# Patient Record
Sex: Male | Born: 1975 | Hispanic: No | Marital: Single
Health system: Northeastern US, Academic
[De-identification: ages and names within clinical notes are randomized; demographics above are authoritative.]

## PROBLEM LIST (undated history)

## (undated) HISTORY — PX: CHOLECYSTECTOMY: SHX55

---

## 1999-11-02 ENCOUNTER — Emergency Department (HOSPITAL_COMMUNITY): Admission: EM | Admit: 1999-11-02 | Discharge: 1999-11-02 | Payer: Self-pay | Admitting: Emergency Medicine

## 2000-06-11 ENCOUNTER — Emergency Department (HOSPITAL_COMMUNITY): Admission: EM | Admit: 2000-06-11 | Discharge: 2000-06-11 | Payer: Self-pay

## 2000-06-14 ENCOUNTER — Emergency Department (HOSPITAL_COMMUNITY): Admission: EM | Admit: 2000-06-14 | Discharge: 2000-06-14 | Payer: Self-pay | Admitting: Emergency Medicine

## 2002-05-24 ENCOUNTER — Encounter: Payer: Self-pay | Admitting: Emergency Medicine

## 2002-05-24 ENCOUNTER — Emergency Department (HOSPITAL_COMMUNITY): Admission: EM | Admit: 2002-05-24 | Discharge: 2002-05-24 | Payer: Self-pay

## 2003-08-16 ENCOUNTER — Inpatient Hospital Stay (HOSPITAL_COMMUNITY): Admission: AC | Admit: 2003-08-16 | Discharge: 2003-08-27 | Payer: Self-pay

## 2003-10-10 ENCOUNTER — Inpatient Hospital Stay (HOSPITAL_COMMUNITY): Admission: RE | Admit: 2003-10-10 | Discharge: 2003-10-12 | Payer: Self-pay | Admitting: Orthopedic Surgery

## 2003-10-31 ENCOUNTER — Encounter: Admission: RE | Admit: 2003-10-31 | Discharge: 2004-01-03 | Payer: Self-pay | Admitting: Orthopedic Surgery

## 2004-02-19 ENCOUNTER — Encounter: Admission: RE | Admit: 2004-02-19 | Discharge: 2004-03-26 | Payer: Self-pay | Admitting: Orthopedic Surgery

## 2004-10-06 ENCOUNTER — Ambulatory Visit: Payer: Self-pay | Admitting: Family Medicine

## 2004-10-27 ENCOUNTER — Ambulatory Visit: Payer: Self-pay | Admitting: Family Medicine

## 2005-02-24 ENCOUNTER — Ambulatory Visit: Payer: Self-pay | Admitting: Family Medicine

## 2006-09-29 DIAGNOSIS — K219 Gastro-esophageal reflux disease without esophagitis: Secondary | ICD-10-CM

## 2007-03-15 ENCOUNTER — Encounter: Admission: RE | Admit: 2007-03-15 | Discharge: 2007-03-15 | Payer: Self-pay | Admitting: Orthopedic Surgery
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
## 2010-12-18 NOTE — Op Note (Signed)
Stephen Riddle, Stephen Riddle                          ACCOUNT NO.:  [id number]   MEDICAL RECORD NO.:  [id number]                   PATIENT TYPE:  INP   LOCATION:  5036                                 FACILITY:  [facility]   PHYSICIAN:  Burnard Bunting, M.D.                 DATE OF BIRTH:  05/29/1976   DATE OF PROCEDURE:  08/18/2003  DATE OF DISCHARGE:                                 OPERATIVE REPORT   PREOPERATIVE DIAGNOSIS:  Open right tibia-fibula fracture.   POSTOPERATIVE DIAGNOSIS:  Open right tibia-fibula fracture.   PROCEDURE:  Excisional debridement of necrotic skin, subcutaneous tissue,  muscle fascia and bone.   SURGEON:  Burnard Bunting, M.D.   ANESTHESIA:  General endotracheal anesthesia.   ESTIMATED BLOOD LOSS:  50 mL.   DRAINS:  None.   DESCRIPTION OF PROCEDURE:  The patient was brought to the operating room  where general anesthesia was induced. Intravenous antibiotics were  maintained. The right leg and external fixator were prepped with Hibiclens  and saline and draped in a sterile manner.   The patient had a transverse open incision approximately 4 cm below the  tibial tubercle. The patient had a 2nd oblique incision about 10 cm proximal  to the tip of the fibula. This was incision was sutured closed. The patient  also had about a 2.5-cm laceration on the posterior aspect of his medial  calf at the midportion of the tibia. This incision was not sutured.   The retention sutures from the proximal incision were removed. The  devitalized skin, subcutaneous tissue, muscle fascia and bone were debrided.  There was a bony spike near the open fracture which was clipped with a  rongeur. This measured about 5 x 5 mm. It was tenting the  skin  and was  subsequently removed. All in all the wound itself appeared  healthy and  viable without gross contamination. Then 3 liters of irrigating solution  under low pressure was used to irrigate this open fracture laceration site.   Following  excisional debridement and irrigation, the posterior calf wound  was also irrigated. It was closed using a single nylon suture. The proximal  incision was then closed in far-near-near-far fashion using 2-0 nylon. A  Hemovac drain was placed. All pin sites were cleaned and Bactroban was  applied to the pin sites. Bactroban and Xeroform were also subsequently  applied to the incision sites and the pin sites. The foot and leg was placed  in a bulky compressive wrap.   The patient tolerated the procedure well. There were no  immediate  complications.                                              Burnard Bunting, M.D.   GSD/MEDQ  D:  08/18/2003  T:  08/18/2003  Job:  [id number]
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
## 2012-10-06 ENCOUNTER — Emergency Department (INDEPENDENT_AMBULATORY_CARE_PROVIDER_SITE_OTHER)
Admission: EM | Admit: 2012-10-06 | Discharge: 2012-10-06 | Disposition: A | Discharge status: Home / Self Care | Payer: Self-pay | Source: Home / Self Care

## 2012-10-06 ENCOUNTER — Encounter (HOSPITAL_COMMUNITY): Payer: Self-pay

## 2012-10-06 DIAGNOSIS — S335XXA Sprain of ligaments of lumbar spine, initial encounter: Secondary | ICD-10-CM

## 2012-10-06 NOTE — ED Provider Notes (Signed)
Medical screening examination/treatment/procedure(s) were performed by non-physician practitioner and as supervising physician I was immediately available for consultation/collaboration.  Leslee Home, M.D.  Reuben Likes, MD 10/06/12 2013
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
## 2012-10-06 NOTE — ED Notes (Signed)
rx for diclofenac 50 mg #20 , 1 tab TID w food for pain dispensed to patient
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
## 2012-10-09 MED ORDER — DICLOFENAC SODIUM 50 MG PO TBEC: 50.0000 mg | DELAYED_RELEASE_TABLET | Freq: Two times a day (BID) | ORAL | Status: DC

## 2012-10-10 NOTE — ED Provider Notes (Signed)
Medical screening examination/treatment/procedure(s) were performed by non-physician practitioner and as supervising physician I was immediately available for consultation/collaboration.  Leslee Home, M.D.   Reuben Likes, MD 10/10/12 2051

## 2015-05-15 ENCOUNTER — Inpatient Hospital Stay (HOSPITAL_COMMUNITY): Payer: Self-pay

## 2015-05-15 ENCOUNTER — Inpatient Hospital Stay (HOSPITAL_COMMUNITY)
Admission: EM | Admit: 2015-05-15 | Discharge: 2015-05-17 | DRG: 446 | Disposition: A | Discharge status: Home / Self Care | Payer: Self-pay | Attending: Internal Medicine | Admitting: Internal Medicine

## 2015-05-15 ENCOUNTER — Emergency Department (HOSPITAL_COMMUNITY): Payer: Self-pay

## 2015-05-15 ENCOUNTER — Encounter (HOSPITAL_COMMUNITY): Payer: Self-pay | Admitting: Emergency Medicine

## 2015-05-15 DIAGNOSIS — Z9049 Acquired absence of other specified parts of digestive tract: Secondary | ICD-10-CM

## 2015-05-15 DIAGNOSIS — R1013 Epigastric pain: Secondary | ICD-10-CM

## 2015-05-15 DIAGNOSIS — R740 Nonspecific elevation of levels of transaminase and lactic acid dehydrogenase [LDH]: Secondary | ICD-10-CM

## 2015-05-15 DIAGNOSIS — F17200 Nicotine dependence, unspecified, uncomplicated: Secondary | ICD-10-CM | POA: Diagnosis present

## 2015-05-15 DIAGNOSIS — R748 Abnormal levels of other serum enzymes: Secondary | ICD-10-CM

## 2015-05-15 DIAGNOSIS — K838 Other specified diseases of biliary tract: Secondary | ICD-10-CM | POA: Diagnosis present

## 2015-05-15 DIAGNOSIS — R109 Unspecified abdominal pain: Secondary | ICD-10-CM | POA: Diagnosis present

## 2015-05-15 DIAGNOSIS — R7989 Other specified abnormal findings of blood chemistry: Secondary | ICD-10-CM

## 2015-05-15 DIAGNOSIS — R1011 Right upper quadrant pain: Secondary | ICD-10-CM

## 2015-05-15 DIAGNOSIS — R945 Abnormal results of liver function studies: Secondary | ICD-10-CM

## 2015-05-15 DIAGNOSIS — F1290 Cannabis use, unspecified, uncomplicated: Secondary | ICD-10-CM | POA: Diagnosis present

## 2015-05-15 DIAGNOSIS — K8050 Calculus of bile duct without cholangitis or cholecystitis without obstruction: Principal | ICD-10-CM | POA: Diagnosis present

## 2015-05-15 DIAGNOSIS — K8020 Calculus of gallbladder without cholecystitis without obstruction: Secondary | ICD-10-CM

## 2015-05-15 LAB — COMPREHENSIVE METABOLIC PANEL
ALT: 293 U/L — ABNORMAL HIGH (ref 17–63)
AST: 433 U/L — ABNORMAL HIGH (ref 15–41)
Albumin: 4.2 g/dL (ref 3.5–5.0)
Alkaline Phosphatase: 239 U/L — ABNORMAL HIGH (ref 38–126)
Anion gap: 8 (ref 5–15)
BUN: 8 mg/dL (ref 6–20)
CO2: 26 mmol/L (ref 22–32)
Calcium: 9.4 mg/dL (ref 8.9–10.3)
Chloride: 105 mmol/L (ref 101–111)
Creatinine, Ser: 0.83 mg/dL (ref 0.61–1.24)
GFR calc Af Amer: >60 mL/min (ref >60)
Glucose, Bld: 91 mg/dL (ref 65–99)
Potassium: 4.9 mmol/L (ref 3.5–5.1)
SODIUM: 139 mmol/L (ref 135–145)
Total Bilirubin: 3.2 mg/dL — ABNORMAL HIGH (ref 0.3–1.2)
Total Protein: 8 g/dL (ref 6.5–8.1)

## 2015-05-15 LAB — LIPASE, BLOOD: LIPASE: 27 U/L (ref 22–51)

## 2015-05-15 LAB — CBC
HCT: 48.8 % (ref 39.0–52.0)
Hemoglobin: 16.6 g/dL (ref 13.0–17.0)
MCH: 29.9 pg (ref 26.0–34.0)
MCHC: 34 g/dL (ref 30.0–36.0)
MCV: 87.9 fL (ref 78.0–100.0)
Platelets: 225 10*3/uL (ref 150–400)
RBC: 5.55 MIL/uL (ref 4.22–5.81)
RDW: 13.7 % (ref 11.5–15.5)
WBC: 12.9 10*3/uL — ABNORMAL HIGH (ref 4.0–10.5)

## 2015-05-15 LAB — URINALYSIS, ROUTINE W REFLEX MICROSCOPIC
Glucose, UA: NEGATIVE mg/dL
Hgb urine dipstick: NEGATIVE
KETONES UR: NEGATIVE mg/dL
LEUKOCYTES UA: NEGATIVE
Nitrite: NEGATIVE
Protein, ur: NEGATIVE mg/dL
Specific Gravity, Urine: 1.019 (ref 1.005–1.030)
Urobilinogen, UA: 1 mg/dL (ref 0.0–1.0)
pH: 8 (ref 5.0–8.0)

## 2015-05-15 LAB — PROTIME-INR
INR: 0.93 (ref 0.00–1.49)
PROTHROMBIN TIME: 12.7 s (ref 11.6–15.2)

## 2015-05-15 MED ORDER — PANTOPRAZOLE SODIUM 40 MG IV SOLR
40.0000 mg | Freq: Two times a day (BID) | INTRAVENOUS | Status: DC
  Administered 2015-05-15 – 2015-05-17 (×4): 40 mg via INTRAVENOUS
  Filled 2015-05-15 (×4): qty 40

## 2015-05-15 MED ORDER — PANTOPRAZOLE SODIUM 40 MG PO TBEC
40.0000 mg | DELAYED_RELEASE_TABLET | Freq: Once | ORAL | Status: AC
  Administered 2015-05-15: 40 mg via ORAL
  Filled 2015-05-15: qty 1

## 2015-05-15 MED ORDER — ACETAMINOPHEN 650 MG RE SUPP: 650.0000 mg | Freq: Four times a day (QID) | RECTAL | Status: DC | PRN

## 2015-05-15 MED ORDER — SODIUM CHLORIDE 0.9 % IV SOLN
3.0000 g | Freq: Once | INTRAVENOUS | Status: AC
  Administered 2015-05-15: 3 g via INTRAVENOUS
  Filled 2015-05-15: qty 3

## 2015-05-15 MED ORDER — OMEPRAZOLE 20 MG PO CPDR: 20.0000 mg | DELAYED_RELEASE_CAPSULE | Freq: Every day | ORAL | Status: DC

## 2015-05-15 MED ORDER — ALUM & MAG HYDROXIDE-SIMETH 200-200-20 MG/5ML PO SUSP: 30.0000 mL | Freq: Four times a day (QID) | ORAL | Status: DC | PRN

## 2015-05-15 MED ORDER — SODIUM CHLORIDE 0.9 % IV SOLN
INTRAVENOUS | Status: DC
Start: 2015-05-15 — End: 2015-05-17
  Administered 2015-05-15 – 2015-05-17 (×4): via INTRAVENOUS

## 2015-05-15 MED ORDER — ONDANSETRON HCL 4 MG PO TABS: 4.0000 mg | ORAL_TABLET | Freq: Four times a day (QID) | ORAL | Status: DC | PRN

## 2015-05-15 MED ORDER — MORPHINE SULFATE (PF) 4 MG/ML IV SOLN
4.0000 mg | Freq: Once | INTRAVENOUS | Status: AC
  Administered 2015-05-15: 4 mg via INTRAVENOUS
  Filled 2015-05-15: qty 1

## 2015-05-15 MED ORDER — ONDANSETRON HCL 4 MG/2ML IJ SOLN: 4.0000 mg | Freq: Four times a day (QID) | INTRAMUSCULAR | Status: DC | PRN

## 2015-05-15 MED ORDER — SODIUM CHLORIDE 0.9 % IV SOLN
3.0000 g | Freq: Four times a day (QID) | INTRAVENOUS | Status: DC
  Administered 2015-05-16 – 2015-05-17 (×6): 3 g via INTRAVENOUS
  Filled 2015-05-15 (×7): qty 3

## 2015-05-15 MED ORDER — OXYCODONE HCL 5 MG PO TABS
5.0000 mg | ORAL_TABLET | ORAL | Status: DC | PRN
  Administered 2015-05-16: 5 mg via ORAL
  Filled 2015-05-15: qty 1

## 2015-05-15 MED ORDER — GI COCKTAIL ~~LOC~~
30.0000 mL | Freq: Once | ORAL | Status: AC
  Administered 2015-05-15: 30 mL via ORAL
  Filled 2015-05-15: qty 30

## 2015-05-15 MED ORDER — SUCRALFATE 1 GM/10ML PO SUSP: 1.0000 g | Freq: Three times a day (TID) | ORAL | Status: DC

## 2015-05-15 MED ORDER — BOOST / RESOURCE BREEZE PO LIQD
1.0000 | Freq: Three times a day (TID) | ORAL | Status: DC
  Administered 2015-05-15 – 2015-05-16 (×2): 1 via ORAL

## 2015-05-15 MED ORDER — MORPHINE SULFATE (PF) 2 MG/ML IV SOLN
1.0000 mg | INTRAVENOUS | Status: DC | PRN
  Administered 2015-05-15 – 2015-05-16 (×2): 1 mg via INTRAVENOUS
  Filled 2015-05-15 (×3): qty 1

## 2015-05-15 MED ORDER — ACETAMINOPHEN 325 MG PO TABS: 650.0000 mg | ORAL_TABLET | Freq: Four times a day (QID) | ORAL | Status: DC | PRN

## 2015-05-15 MED ORDER — ONDANSETRON HCL 4 MG/2ML IJ SOLN
4.0000 mg | Freq: Once | INTRAMUSCULAR | Status: AC
  Administered 2015-05-15: 4 mg via INTRAVENOUS
  Filled 2015-05-15: qty 2

## 2015-05-15 MED ORDER — GADOBENATE DIMEGLUMINE 529 MG/ML IV SOLN
15.0000 mL | Freq: Once | INTRAVENOUS | Status: AC | PRN
  Administered 2015-05-15: 14 mL via INTRAVENOUS

## 2015-05-15 MED ORDER — DOCUSATE SODIUM 100 MG PO CAPS
100.0000 mg | ORAL_CAPSULE | Freq: Two times a day (BID) | ORAL | Status: DC
  Administered 2015-05-16 – 2015-05-17 (×2): 100 mg via ORAL
  Filled 2015-05-15 (×3): qty 1

## 2015-05-15 NOTE — Progress Notes (Signed)
New Admission Note:   Arrival Method: via wheelchair from ED Mental Orientation: Alert and oriented x4 Telemetry: N/A Assessment: Completed Skin: Intact, warm, and dry IV: Right AC Peripheral IV Normal Saline @ 125 mL/hr  Pain: Denies pain at this time Tubes: N/A Safety Measures: Educated on fall prevention safety plan, patient acknowledged and understood. Admission: Completed [address] Orientation: Patient has been orientated to the room, unit and staff.  Family: Updated by patient  Orders have been reviewed and implemented. Will continue to monitor the patient. Call light has been placed within reach and bed alarm has been activated.    Billy FischerLynn Cherril Hett, RN  Phone number: [phone]

## 2015-05-15 NOTE — ED Provider Notes (Addendum)
CSN: [id number]     Arrival date & time 05/15/15  1127 History   First MD Initiated Contact with Patient 05/15/15 1142     Chief Complaint  Patient presents with  . Abdominal Pain     (Consider location/radiation/quality/duration/timing/severity/associated sxs/prior Treatment) HPI Comments: Patient presents to the ER for evaluation of abdominal pain. Patient reports that he has been experiencing intermittent episodes of severe upper abdominal pain for the last 2 months or more. He has not identified any causal factors, but it does occur when he eats sometimes. He reports that the pain often comes on suddenly and then last for hours or even a day and then resolves. He has had mild nausea but no vomiting. There is no diarrhea or constipation. He denies rectal bleeding and melena. Patient has had a previous cholecystectomy. Patient has previously been diagnosed with gastritis and was treated with Pepcid. He reports that it used to work, but now he feels like it is not helping.  Patient is a 39 y.o. male presenting with abdominal pain.  Abdominal Pain   History reviewed. No pertinent past medical history. Past Surgical History  Procedure Laterality Date  . Cholecystectomy     No family history on file. Social History  Substance Use Topics  . Smoking status: Current Every Day Smoker -- 0.50 packs/day  . Smokeless tobacco: None  . Alcohol Use: Yes    Review of Systems  Gastrointestinal: Positive for abdominal pain.  All other systems reviewed and are negative.     Allergies  Review of patient's allergies indicates no known allergies.  Home Medications   Prior to Admission medications   Not on File   BP 138/81 mmHg  Pulse 69  Temp(Src) 98 F (36.7 C) (Temporal)  Resp 19  SpO2 100% Physical Exam  Constitutional: He is oriented to person, place, and time. He appears well-developed and well-nourished. No distress.  HENT:  Head: Normocephalic and atraumatic.  Right Ear:  Hearing normal.  Left Ear: Hearing normal.  Nose: Nose normal.  Mouth/Throat: Oropharynx is clear and moist and mucous membranes are normal.  Eyes: Conjunctivae and EOM are normal. Pupils are equal, round, and reactive to light.  Neck: Normal range of motion. Neck supple.  Cardiovascular: Regular rhythm, S1 normal and S2 normal.  Exam reveals no gallop and no friction rub.   No murmur heard. Pulmonary/Chest: Effort normal and breath sounds normal. No respiratory distress. He exhibits no tenderness.  Abdominal: Soft. Normal appearance and bowel sounds are normal. There is no hepatosplenomegaly. There is tenderness in the epigastric area. There is no rebound, no guarding, no tenderness at McBurney's point and negative Murphy's sign. No hernia.  Musculoskeletal: Normal range of motion.  Neurological: He is alert and oriented to person, place, and time. He has normal strength. No cranial nerve deficit or sensory deficit. Coordination normal. GCS eye subscore is 4. GCS verbal subscore is 5. GCS motor subscore is 6.  Skin: Skin is warm, dry and intact. No rash noted. No cyanosis.  Psychiatric: He has a normal mood and affect. His speech is normal and behavior is normal. Thought content normal.  Nursing note and vitals reviewed.   ED Course  Procedures (including critical care time) Labs Review Labs Reviewed  COMPREHENSIVE METABOLIC PANEL - Abnormal; Notable for the following:    AST 433 (*)    ALT 293 (*)    Alkaline Phosphatase 239 (*)    Total Bilirubin 3.2 (*)    All other components within  normal limits  CBC - Abnormal; Notable for the following:    WBC 12.9 (*)    All other components within normal limits  URINALYSIS, ROUTINE W REFLEX MICROSCOPIC (NOT AT [facility]) - Abnormal; Notable for the following:    Color, Urine AMBER (*)    Bilirubin Urine MODERATE (*)    All other components within normal limits  LIPASE, BLOOD  PROTIME-INR  HEPATITIS PANEL, ACUTE    Imaging Review [place]  Abdomen Limited Ruq  05/15/2015  CLINICAL DATA:  Elevated liver function tests, abdominal pain for 6 months, prior cholecystectomy EXAM: US ABDOMEN LIMITED - RIGHT UPPER QUADRANT COMPARISON:  CT abdomen pelvis of 08/19/2003 FINDINGS: Gallbladder: The gallbladder has previously been resected. Common bile duct: Diameter: The common bile duct is dilated measuring 15 mm in diameter, possibly normal post cholecystectomy. If further assessment is warranted CT of the abdomen with IV contrast media is recommended versus MR with MRCP. Liver: The liver has a normal echogenic pattern. Intrahepatic ducts are noted to be prominent, and as noted above CT or MRI is recommended to assess for a possible distal common bile duct abnormality. And echogenic focus in the left lobe may represent calcified granuloma. IMPRESSION: Dilated common bile duct and intrahepatic bile ducts. Although this may be normal post cholecystectomy, a distal common bile duct lesion cannot be excluded. Consider MR of the abdomen with MRCP versus CT of the abdomen with IV contrast media. Electronically Signed   By: Dwyane DeePaul  Barry M.D.   On: 05/15/2015 16:46   I have personally reviewed and evaluated these images and lab results as part of my medical decision-making.   EKG Interpretation None      MDM   Final diagnoses:  Epigastric pain  Elevated LFTs   Patient presents to the emergency department for evaluation of abdominal pain. Patient reports that he has been experiencing intermittent episodes of abdominal pain for at least 2 months. Pain is in the upper abdomen when it occurs. He has not identified any clear causal agent, but does sometimes worsened when he eats. Pain lasts for hours or up to 24 hours at a time and then resolves. Patient has had previous cholecystectomy.  Bloodwork reveals moderately elevated LFTs. Patient admits to heavy drinking on the weekends. Is felt that this is likely secondary to his alcohol intake. Ultrasound shows  intrahepatic ductal dilatation and dilatation of common bile duct. Discussed with gastroenterology. Recommend MRCP an admission to the hospitalist service, will consult.  Gilda Creasehristopher J Roise Emert, MD 05/15/15 1609  Gilda Creasehristopher J Pleasant Britz, MD 05/15/15 1710

## 2015-05-15 NOTE — ED Notes (Signed)
Patient called out and stated that his pain is increasing. Asked patient if he wanted any pain medication. Patient stated, "No not right now. I want to figure out what is going on first".

## 2015-05-15 NOTE — H&P (Signed)
[facility] History and Physical  Stephen Riddle YQM:[id number] DOB: 1976-02-24 DOA: 05/15/2015  Referring physician:  PCP: No primary care provider on file.   Chief Complaint: abdominal pain.   HPI: Stephen Riddle is a 39 y.o. male with PMH significant for cholecystectomy, who presents complaining of abdominal pain, epigastric and RUQ since couple of months ago. The abdominal pain is getting worse. It is aggravated by meals. He denies diarrhea, associated with nausea. He drinks alcohol on weekends.    Review of Systems:  Negative except as per HPI   History reviewed. No pertinent past medical history. Past Surgical History  Procedure Laterality Date  . Cholecystectomy     Social History:  reports that he has been smoking.  He does not have any smokeless tobacco history on file. He reports that he drinks alcohol. He reports that he uses illicit drugs (Marijuana).  No Known Allergies  Family History: father with history of gallstone.   Prior to Admission medications   Not on File   Physical Exam: Filed Vitals:   05/15/15 1414 05/15/15 1632 05/15/15 1835 05/15/15 1852  BP: 133/80 138/81 117/75   Pulse: 67 69 64   Temp: 98.1 F (36.7 C) 98 F (36.7 C) 99 F (37.2 C)   TempSrc: Oral Temporal Temporal   Resp: Height:     (1.803 m)  Weight:    67.677 kg (149 lb 3.2 oz)  SpO2: 100% 100% 100%     Wt Readings from Last 3 Encounters:  05/15/15 67.677 kg (149 lb 3.2 oz)    General:  Appears calm and comfortable Eyes: PERRL, normal lids, irises & conjunctiva ENT: grossly normal hearing, lips & tongue Neck: no LAD, masses or thyromegaly Cardiovascular: RRR, no m/r/g. No LE edema. Telemetry: SR, no arrhythmias  Respiratory: CTA bilaterally, no w/r/r. Normal respiratory effort. Abdomen: soft, mild epigastric tenderness, no significant RUQ tenderness.  Skin: no rash or induration seen on limited exam Musculoskeletal: grossly normal tone  BUE/BLE Psychiatric: grossly normal mood and affect, speech fluent and appropriate Neurologic: grossly non-focal.          Labs on Admission:  Basic Metabolic Panel:  Recent Labs Lab 05/15/15 1156  NA 139  K 4.9  CL 105  CO2 26  GLUCOSE 91  BUN 8  CREATININE 0.83  CALCIUM 9.4   Liver Function Tests:  Recent Labs Lab 05/15/15 1156  AST 433*  ALT 293*  ALKPHOS 239*  BILITOT 3.2*  PROT 8.0  ALBUMIN 4.2    Recent Labs Lab 05/15/15 1156  LIPASE 27   No results for input(s): AMMONIA in the last 168 hours. CBC:  Recent Labs Lab 05/15/15 1156  WBC 12.9*  HGB 16.6  HCT 48.8  MCV 87.9  PLT 225   Cardiac Enzymes: No results for input(s): CKTOTAL, CKMB, CKMBINDEX, TROPONINI in the last 168 hours.  BNP (last 3 results) No results for input(s): BNP in the last 8760 hours.  ProBNP (last 3 results) No results for input(s): PROBNP in the last 8760 hours.  CBG: No results for input(s): GLUCAP in the last 168 hours.  Radiological Exams on Admission: US Abdomen Limited Ruq  05/15/2015  CLINICAL DATA:  Elevated liver function tests, abdominal pain for 6 months, prior cholecystectomy EXAM: US ABDOMEN LIMITED - RIGHT UPPER QUADRANT COMPARISON:  CT abdomen pelvis of 08/19/2003 FINDINGS: Gallbladder: The gallbladder has previously been resected. Common bile duct: Diameter: The common bile duct is dilated measuring 15  mm in diameter, possibly normal post cholecystectomy. If further assessment is warranted CT of the abdomen with IV contrast media is recommended versus MR with MRCP. Liver: The liver has a normal echogenic pattern. Intrahepatic ducts are noted to be prominent, and as noted above CT or MRI is recommended to assess for a possible distal common bile duct abnormality. And echogenic focus in the left lobe may represent calcified granuloma. IMPRESSION: Dilated common bile duct and intrahepatic bile ducts. Although this may be normal post cholecystectomy, a distal  common bile duct lesion cannot be excluded. Consider MR of the abdomen with MRCP versus CT of the abdomen with IV contrast media. Electronically Signed   By: Dwyane DeePaul  Barry M.D.   On: 05/15/2015 16:46    EKG: No EKG available.   Assessment/Plan Active Problems:   Abdominal pain   Abnormal transaminases  1-RUQ, epigastric pain with transaminases: suspect obstructive cholestatic. RUQ US with dilated Common bile duct and intra hepatic bile ducts. A distal common bile duct is not excluded. Patient presents with elevated LFT and bilirubin.  IV antibiotics due to leukocytosis.  MRCP ordered.  GI has been consulted, depending on MRCP patient might required ERCP.  IV fluids, IV protonix. IV pain medications.   2-Transaminases: Viral hepatitis panel ordered. See problem Number one.  Also might be related to history of alcohol use.   3-Screening for HIV.    Code Status: full code.  DVT Prophylaxis: SCD anticipating procedure 10-14 Family Communication: Care discussed with patient and wife who was at bedside.  Disposition Plan: Expect 2 to 3 days inpatient  Time spent: 75 minutes.   Hartley Barefootegalado, Belkys A [facility] Pager [phone]

## 2015-05-15 NOTE — Progress Notes (Signed)
[facility] Community Liaison [place],  [place] about [employer] [place]. [place] [facility] [place]. CL scheduled patient an appointment with PCP on Friday, October 14th at 4 pm with Cari Carawayonna Odem, NP. Per Dois DavenportSandra at [facility]-patient will need to be discharged from ED, 24 hours prior to office visit. CL explained to patient that he would need to contact [facility] upon discharge to be sure that he can still be seen tomorrow.

## 2015-05-15 NOTE — ED Notes (Signed)
Pharmacy stated to give 3g Unasyn, not weight based dose

## 2015-05-15 NOTE — ED Notes (Signed)
Pt reports intermittent mid upper abdominal pain x2 months, denies N/V/D, fever or chills.

## 2015-05-15 NOTE — Progress Notes (Signed)
ANTIBIOTIC CONSULT NOTE - INITIAL  Pharmacy Consult for Unasyn Indication: intra-abdominal infection  No Known Allergies  Patient Measurements: Height: 71 inches Total Body Weight: 67.7 kg   Vital Signs: Temp: 98 F (36.7 C) (10/13 1632) Temp Source: Temporal (10/13 1632) BP: 138/81 mmHg (10/13 1632) Pulse Rate: 69 (10/13 1632) Intake/Output from previous day:   Intake/Output from this shift:    Labs:  Recent Labs  05/15/15 1156  WBC 12.9*  HGB 16.6  PLT 225  CREATININE 0.83   CrCl cannot be calculated (Unknown ideal weight.). No results for input(s): VANCOTROUGH, VANCOPEAK, VANCORANDOM, GENTTROUGH, GENTPEAK, GENTRANDOM, TOBRATROUGH, TOBRAPEAK, TOBRARND, AMIKACINPEAK, AMIKACINTROU, AMIKACIN in the last 72 hours.   Microbiology: No results found for this or any previous visit (from the past 720 hour(s)).  Medical History: History reviewed. No pertinent past medical history.  Medications:  Scheduled:   Infusions:  . ampicillin-sulbactam (UNASYN) IV     PRN:  Assessment: 38yoM with previous cholecystectomy presenting with abdominal pain. Pt reports intermittent episodes of severe upper abdominal pain for the last 2 months, often with sudden onset and lasting for hours or even a day. Unasyn to be started for intra-abdominal infection.  Goal of Therapy:  Appropriate antibiotic dosing for indication and renal function. Eradication of infection  Plan:  Unasyn 3gm Iv x 1 ordered earlier. Start Unasyn 3gm IV Q6h. Will f/u Scr and adjust dose as needed.    Dorethea ClanFrens, Lilliane Sposito Ann 05/15/2015,6:03 PM

## 2015-05-15 NOTE — ED Notes (Signed)
Ultrasound at bedside

## 2015-05-15 NOTE — Progress Notes (Addendum)
[facility] staff made pt an appt for [facility] [place] with  donna odom  On 05/16/15 at 4 pm Pt given written sheet with this information by [facility] staff, Stacy   Follow-up With Details Why Contact Info [facility] on 05/16/2015 You have been scheduled an appointment to see Mitzie Naonna Odom on Tomorrow at 4 pm PLEASE CALL THEM AS SOON AS YOU ARE DISCHARGED TODAY TO SEE IF THIS NEEDS RESCHEDULED [address] [place] [address]  [place]  [phone]

## 2015-05-16 ENCOUNTER — Inpatient Hospital Stay (HOSPITAL_COMMUNITY): Payer: Self-pay | Admitting: Anesthesiology

## 2015-05-16 ENCOUNTER — Inpatient Hospital Stay (HOSPITAL_COMMUNITY): Payer: Self-pay

## 2015-05-16 ENCOUNTER — Inpatient Hospital Stay (HOSPITAL_COMMUNITY): Payer: MEDICAID | Admitting: Anesthesiology

## 2015-05-16 ENCOUNTER — Inpatient Hospital Stay (HOSPITAL_COMMUNITY): Payer: MEDICAID

## 2015-05-16 ENCOUNTER — Encounter (HOSPITAL_COMMUNITY): Payer: Self-pay | Admitting: *Deleted

## 2015-05-16 ENCOUNTER — Encounter (HOSPITAL_COMMUNITY)
Admission: EM | Disposition: A | Discharge status: Home / Self Care | Payer: Self-pay | Source: Home / Self Care | Attending: Internal Medicine

## 2015-05-16 DIAGNOSIS — K8050 Calculus of bile duct without cholangitis or cholecystitis without obstruction: Principal | ICD-10-CM | POA: Insufficient documentation

## 2015-05-16 DIAGNOSIS — R1013 Epigastric pain: Secondary | ICD-10-CM

## 2015-05-16 HISTORY — PX: ERCP: SHX5425

## 2015-05-16 LAB — COMPREHENSIVE METABOLIC PANEL
ALBUMIN: 3.4 g/dL — AB (ref 3.5–5.0)
ALK PHOS: 253 U/L — AB (ref 38–126)
ALT: 261 U/L — AB (ref 17–63)
AST: 190 U/L — AB (ref 15–41)
Anion gap: 6 (ref 5–15)
BILIRUBIN TOTAL: 1.9 mg/dL — AB (ref 0.3–1.2)
BUN: 7 mg/dL (ref 6–20)
CALCIUM: 8.3 mg/dL — AB (ref 8.9–10.3)
CO2: 22 mmol/L (ref 22–32)
CREATININE: 0.69 mg/dL (ref 0.61–1.24)
Chloride: 108 mmol/L (ref 101–111)
GFR calc Af Amer: >60 mL/min (ref >60)
GLUCOSE: 94 mg/dL (ref 65–99)
POTASSIUM: 3.8 mmol/L (ref 3.5–5.1)
Sodium: 136 mmol/L (ref 135–145)
TOTAL PROTEIN: 6.4 g/dL — AB (ref 6.5–8.1)

## 2015-05-16 LAB — CBC
HEMATOCRIT: 41.9 % (ref 39.0–52.0)
HEMOGLOBIN: 14.3 g/dL (ref 13.0–17.0)
MCH: 29.8 pg (ref 26.0–34.0)
MCHC: 34.1 g/dL (ref 30.0–36.0)
MCV: 87.3 fL (ref 78.0–100.0)
Platelets: 177 10*3/uL (ref 150–400)
RBC: 4.8 MIL/uL (ref 4.22–5.81)
RDW: 13.7 % (ref 11.5–15.5)
WBC: 7.7 10*3/uL (ref 4.0–10.5)

## 2015-05-16 LAB — HEPATITIS PANEL, ACUTE
HEP A IGM: NEGATIVE
Hep B C IgM: NEGATIVE
Hepatitis B Surface Ag: NEGATIVE

## 2015-05-16 LAB — HIV ANTIBODY (ROUTINE TESTING W REFLEX): HIV SCREEN 4TH GENERATION: NONREACTIVE

## 2015-05-16 SURGERY — ERCP, WITH INTERVENTION IF INDICATED
Anesthesia: General

## 2015-05-16 MED ORDER — SODIUM CHLORIDE 0.9 % IV SOLN
INTRAVENOUS | Status: DC | PRN
  Administered 2015-05-16: 44 mL

## 2015-05-16 MED ORDER — SUCCINYLCHOLINE CHLORIDE 20 MG/ML IJ SOLN
INTRAMUSCULAR | Status: DC | PRN
  Administered 2015-05-16: 100 mg via INTRAVENOUS

## 2015-05-16 MED ORDER — PROPOFOL 10 MG/ML IV BOLUS
INTRAVENOUS | Status: DC | PRN
  Administered 2015-05-16: 30 mg via INTRAVENOUS
  Administered 2015-05-16: 200 mg via INTRAVENOUS

## 2015-05-16 MED ORDER — ONDANSETRON HCL 4 MG/2ML IJ SOLN
INTRAMUSCULAR | Status: DC | PRN
  Administered 2015-05-16: 4 mg via INTRAVENOUS

## 2015-05-16 MED ORDER — FENTANYL CITRATE (PF) 100 MCG/2ML IJ SOLN: 25.0000 ug | INTRAMUSCULAR | Status: DC | PRN

## 2015-05-16 MED ORDER — LIDOCAINE HCL (CARDIAC) 20 MG/ML IV SOLN
INTRAVENOUS | Status: DC | PRN
  Administered 2015-05-16: 60 mg via INTRAVENOUS

## 2015-05-16 MED ORDER — EPHEDRINE SULFATE 50 MG/ML IJ SOLN
INTRAMUSCULAR | Status: DC | PRN
  Administered 2015-05-16 (×2): 5 mg via INTRAVENOUS

## 2015-05-16 MED ORDER — INDOMETHACIN 50 MG RE SUPP
100.0000 mg | Freq: Once | RECTAL | Status: AC
  Administered 2015-05-16: 100 mg via RECTAL
  Filled 2015-05-16: qty 2

## 2015-05-16 MED ORDER — SODIUM CHLORIDE 0.9 % IV SOLN: INTRAVENOUS | Status: DC

## 2015-05-16 NOTE — H&P (View-Only) (Signed)
Referring Provider: EDP, Dr. Blinda Leatherwood Primary Care Physician:  No primary care provider on file. Primary Gastroenterologist:  None, unassigned  Reason for Consultation:  ? ERCP; elevated LFT's and CBD dilation  HPI: Stephen Riddle is a 39 y.o. male with PMH significant only for cholecystectomy approximately 16 years ago, who presented to [facility] ED complaining of abdominal pain, epigastric and RUQ intermittent for the past 6 months or so.  Feels normal and pain free between episodes.  Aggravated by eating and associated with nausea and belching but no other symptoms.  LFT's are elevated with total bili 32. Now 1.9, AST 433 now 190, ALT 293 now 261, and ALP 239 now 253.  Ultrasound showed the following:  IMPRESSION: Dilated common bile duct and intrahepatic bile ducts. Although this may be normal post cholecystectomy, a distal common bile duct lesion cannot be excluded. Consider MR of the abdomen with MRCP versus CT of the abdomen with IV contrast media.  Patient was admitted and MCRP showed the following:  IMPRESSION: 1. Imaging complicated by large number of cholecystectomy clips and motion artifact. I do suspect some filling defects in the common bile duct including a 0.8 cm in diameter filling defect with low T2 signal rim and high T2 signal center, and a similar but more elongated structure in the common hepatic duct extending to the common bile duct. These have an unusual appearance for stones but presumably represent gallstones. Blood products or sludge in the biliary tree seem possible but less likely. Notably, the intrahepatic and extrahepatic biliary dilatation is not appreciably worsened from multiple prior exams including 08/16/2003. Accordingly I do not believe these filling defects are necessarily obstructive. ERCP may be helpful in further evaluation.  All other labs are normal.  Viral Hep panel negative.  Patient is receiving IV Unasyn.  He currently feels better with minimal  pain.  History reviewed. No pertinent past medical history.  Past Surgical History  Procedure Laterality Date  . Cholecystectomy      Prior to Admission medications   Not on File    Current Facility-Administered Medications  Medication Dose Route Frequency Provider Last Rate Last Dose  . 0.9 %  sodium chloride infusion   Intravenous Continuous Alba Cory, MD 125 mL/hr at 05/15/15 2201    . acetaminophen (TYLENOL) tablet 650 mg  650 mg Oral Q6H PRN Belkys A Regalado, MD       Or  . acetaminophen (TYLENOL) suppository 650 mg  650 mg Rectal Q6H PRN Belkys A Regalado, MD      . alum & mag hydroxide-simeth (MAALOX/MYLANTA) 200-200-20 MG/5ML suspension 30 mL  30 mL Oral Q6H PRN Belkys A Regalado, MD      . Ampicillin-Sulbactam (UNASYN) 3 g in sodium chloride 0.9 % 100 mL IVPB  3 g Intravenous Q6H Dorethea Clan, RPH   3 g at 05/16/15 0348  . docusate sodium (COLACE) capsule 100 mg  100 mg Oral BID Belkys A Regalado, MD   100 mg at 05/15/15 2148  . feeding supplement (BOOST / RESOURCE BREEZE) liquid 1 Container  1 Container Oral TID BM Alba Cory, MD   1 Container at 05/15/15 2204  . morphine 2 MG/ML injection 1 mg  1 mg Intravenous Q4H PRN Belkys A Regalado, MD   1 mg at 05/15/15 2343  . ondansetron (ZOFRAN) tablet 4 mg  4 mg Oral Q6H PRN Belkys A Regalado, MD       Or  . ondansetron (ZOFRAN) injection 4  mg  4 mg Intravenous Q6H PRN Belkys A Regalado, MD      . oxyCODONE (Oxy IR/ROXICODONE) immediate release tablet 5 mg  5 mg Oral Q4H PRN Belkys A Regalado, MD      . pantoprazole (PROTONIX) injection 40 mg  40 mg Intravenous Q12H Belkys A Regalado, MD   40 mg at 05/15/15 2200    Allergies as of 05/15/2015  . (No Known Allergies)    History reviewed. No pertinent family history.  Social History   Social History  . Marital Status: Single    Spouse Name: N/A  . Number of Children: N/A  . Years of Education: N/A   Occupational History  . Not on file.   Social  History Main Topics  . Smoking status: Current Every Day Smoker -- 0.50 packs/day  . Smokeless tobacco: Not on file  . Alcohol Use: Yes  . Drug Use: Yes    Special: Marijuana  . Sexual Activity: Not on file   Other Topics Concern  . Not on file   Social History Narrative    Review of Systems: Ten point ROS is O/W negative except as mentioned in HPI.  Physical Exam: Vital signs in last 24 hours: Temp:  [97.6 F (36.4 C)-99 F (37.2 C)] 98.3 F (36.8 C) (10/14 0534) Pulse Rate:  [64-92] 78 (10/14 0534) Resp:  [17-20] 18 (10/14 0534) BP: (115-138)/(67-89) 115/67 mmHg (10/14 0534) SpO2:  [100 %] 100 % (10/14 0534) Weight:  [149 lb (67.586 kg)-149 lb 3.2 oz (67.677 kg)] 149 lb (67.586 kg) (10/13 2032) Last BM Date: 05/15/15 General:  Alert, Well-developed, well-nourished, pleasant and cooperative in NAD Head:  Normocephalic and atraumatic. Eyes:  Sclera clear, no icterus.  Conjunctiva pink. Ears:  Normal auditory acuity. Mouth:  No deformity or lesions.   Lungs:  Clear throughout to auscultation.  No wheezes, crackles, or rhonchi.  Heart:  Regular rate and rhythm; no murmurs, clicks, rubs, or gallops. Abdomen:  Soft, non-distended.  BS present.  Minimal epigastric TTP currently.  Rectal:  Deferred  Msk:  Symmetrical without gross deformities. Pulses:  Normal pulses noted. Extremities:  Without clubbing or edema. Neurologic:  Alert and oriented x 4;  grossly normal neurologically. Skin:  Intact without significant lesions or rashes. Psych:  Alert and cooperative. Normal mood and affect.  Lab Results:  Recent Labs  05/15/15 1156 05/16/15 0813  WBC 12.9* 7.7  HGB 16.6 14.3  HCT 48.8 41.9  PLT 225 177   BMET  Recent Labs  05/15/15 1156 05/16/15 0813  NA 139 PENDING  K 4.9 PENDING  CL 105 PENDING  CO2 26 PENDING  GLUCOSE 91 94  BUN 8 7  CREATININE 0.83 0.69  CALCIUM 9.4 PENDING   LFT  Recent Labs  05/16/15 0813  PROT 6.4*  ALBUMIN 3.4*  AST 190*   ALT 261*  ALKPHOS 253*  BILITOT 1.9*   PT/INR  Recent Labs  05/15/15 1500  LABPROT 12.7  INR 0.93   Hepatitis Panel  Recent Labs  05/15/15 1500  HEPBSAG Negative  HCVAB <0.1  HEPAIGM Negative  HEPBIGM Negative   Studies/Results: Mr 3d Recon At Scanner  05/16/2015  CLINICAL DATA:  Abdominal pain including the epigastric and right upper quadrant regions, worsening, aggravated by meals. Prior cholecystectomy. Dilated biliary tree on ultrasound EXAM: MRI ABDOMEN WITHOUT AND WITH CONTRAST (INCLUDING MRCP) TECHNIQUE: Multiplanar multisequence MR imaging of the abdomen was performed both before and after the administration of intravenous contrast. Heavily T2-weighted images of the  biliary and pancreatic ducts were obtained, and three-dimensional MRCP images were rendered by post processing. Delayed post-contrast images were not performed due to the patient becoming nauseated. CONTRAST:  14mL MULTIHANCE GADOBENATE DIMEGLUMINE 529 MG/ML IV SOLN COMPARISON:  05/15/2015 FINDINGS: Despite efforts by the technologist and patient, motion artifact is present on today's exam and could not be eliminated. This reduces exam sensitivity and specificity. Lower chest:  Unremarkable Hepatobiliary: Assessment is complicated by the presence of 7 or 8 large metal clips along the common hepatic duct and proximal common bile duct which introduce extensive artifact, in addition to the fairly severe motion artifact on the dedicated MRCP sequence. There is a 3 cm segment of the biliary tree which is rendered essentially uninterpretable ; this would probably require ERCP to be able to visualize better. That said, the intrahepatic and mild extrahepatic biliary dilatation seems to be chronic. Looking at a CT of the abdomen from 08/16/2003, which is also post cholecystectomy, there is a similar degree of intrahepatic biliary dilatation, in the common bile duct measures 8 mm on that study compared to 9 mm today (measured on  image 25, series 12 PICC). I do not observe abnormal enhancement along the biliary system to suggest cholangitis. On some of the coronal images, including image 8 of series 13 and image 23 of series 15, there do appear to be filling defects within the CBD with low T2 signal periphery and higher T2 signal centrally, 1 measuring 1.1 cm distally on image 23 of series 15 and another potentially measuring up to 3.0 by 0.8 cm on image 7 of series 13. Images 6-8 of series 13 seemed show these best. There is no associated abnormal enhancement. No abnormal focal enhancement in the liver. Pancreas: Unremarkable Spleen: Unremarkable Adrenals/Urinary Tract: Unremarkable Stomach/Bowel: Unremarkable Vascular/Lymphatic: Unremarkable Other: No supplemental non-categorized findings. Musculoskeletal: Unremarkable IMPRESSION: 1. Imaging complicated by large number of cholecystectomy clips and motion artifact. I do suspect some filling defects in the common bile duct including a 0.8 cm in diameter filling defect with low T2 signal rim and high T2 signal center, and a similar but more elongated structure in the common hepatic duct extending to the common bile duct. These have an unusual appearance for stones but presumably represent gallstones. Blood products or sludge in the biliary tree seem possible but less likely. Notably, the intrahepatic and extrahepatic biliary dilatation is not appreciably worsened from multiple prior exams including 08/16/2003. Accordingly I do not believe these filling defects are necessarily obstructive. ERCP may be helpful in further evaluation. Electronically Signed   By: Gaylyn RongWalter  Liebkemann M.D.   On: 05/16/2015 08:39   Mr Roe Coombsbd W/wo Cm/mrcp  05/16/2015  CLINICAL DATA:  Abdominal pain including the epigastric and right upper quadrant regions, worsening, aggravated by meals. Prior cholecystectomy. Dilated biliary tree on ultrasound EXAM: MRI ABDOMEN WITHOUT AND WITH CONTRAST (INCLUDING MRCP) TECHNIQUE:  Multiplanar multisequence MR imaging of the abdomen was performed both before and after the administration of intravenous contrast. Heavily T2-weighted images of the biliary and pancreatic ducts were obtained, and three-dimensional MRCP images were rendered by post processing. Delayed post-contrast images were not performed due to the patient becoming nauseated. CONTRAST:  14mL MULTIHANCE GADOBENATE DIMEGLUMINE 529 MG/ML IV SOLN COMPARISON:  05/15/2015 FINDINGS: Despite efforts by the technologist and patient, motion artifact is present on today's exam and could not be eliminated. This reduces exam sensitivity and specificity. Lower chest:  Unremarkable Hepatobiliary: Assessment is complicated by the presence of 7 or 8 large metal clips  along the common hepatic duct and proximal common bile duct which introduce extensive artifact, in addition to the fairly severe motion artifact on the dedicated MRCP sequence. There is a 3 cm segment of the biliary tree which is rendered essentially uninterpretable ; this would probably require ERCP to be able to visualize better. That said, the intrahepatic and mild extrahepatic biliary dilatation seems to be chronic. Looking at a CT of the abdomen from 08/16/2003, which is also post cholecystectomy, there is a similar degree of intrahepatic biliary dilatation, in the common bile duct measures 8 mm on that study compared to 9 mm today (measured on image 25, series 12 PICC). I do not observe abnormal enhancement along the biliary system to suggest cholangitis. On some of the coronal images, including image 8 of series 13 and image 23 of series 15, there do appear to be filling defects within the CBD with low T2 signal periphery and higher T2 signal centrally, 1 measuring 1.1 cm distally on image 23 of series 15 and another potentially measuring up to 3.0 by 0.8 cm on image 7 of series 13. Images 6-8 of series 13 seemed show these best. There is no associated abnormal enhancement. No  abnormal focal enhancement in the liver. Pancreas: Unremarkable Spleen: Unremarkable Adrenals/Urinary Tract: Unremarkable Stomach/Bowel: Unremarkable Vascular/Lymphatic: Unremarkable Other: No supplemental non-categorized findings. Musculoskeletal: Unremarkable IMPRESSION: 1. Imaging complicated by large number of cholecystectomy clips and motion artifact. I do suspect some filling defects in the common bile duct including a 0.8 cm in diameter filling defect with low T2 signal rim and high T2 signal center, and a similar but more elongated structure in the common hepatic duct extending to the common bile duct. These have an unusual appearance for stones but presumably represent gallstones. Blood products or sludge in the biliary tree seem possible but less likely. Notably, the intrahepatic and extrahepatic biliary dilatation is not appreciably worsened from multiple prior exams including 08/16/2003. Accordingly I do not believe these filling defects are necessarily obstructive. ERCP may be helpful in further evaluation. Electronically Signed   By: Gaylyn Rong M.D.   On: 05/16/2015 08:39   US Abdomen Limited Ruq  05/15/2015  CLINICAL DATA:  Elevated liver function tests, abdominal pain for 6 months, prior cholecystectomy EXAM: US ABDOMEN LIMITED - RIGHT UPPER QUADRANT COMPARISON:  CT abdomen pelvis of 08/19/2003 FINDINGS: Gallbladder: The gallbladder has previously been resected. Common bile duct: Diameter: The common bile duct is dilated measuring 15 mm in diameter, possibly normal post cholecystectomy. If further assessment is warranted CT of the abdomen with IV contrast media is recommended versus MR with MRCP. Liver: The liver has a normal echogenic pattern. Intrahepatic ducts are noted to be prominent, and as noted above CT or MRI is recommended to assess for a possible distal common bile duct abnormality. And echogenic focus in the left lobe may represent calcified granuloma. IMPRESSION: Dilated  common bile duct and intrahepatic bile ducts. Although this may be normal post cholecystectomy, a distal common bile duct lesion cannot be excluded. Consider MR of the abdomen with MRCP versus CT of the abdomen with IV contrast media. Electronically Signed   By: Dwyane Dee M.D.   On: 05/15/2015 16:46   IMPRESSION:  -39 year old male with intermittent upper abdominal pain x 6 months with elevated LFT's, CBD dilation to 15 mm, and filling defects seen on MRCP:  LFT's slightly improved this AM.  Suspect CBD stones with partial biliary obstruction.  PLAN: -ERCP at [facility] later  today (1330).  Patient will be transferred there for procedure and back again.  Receiving IV Unasyn.  Post-procedure indomethacin ordered.  Kilynn Fitzsimmons D.  05/16/2015, 9:09 AM  Pager number [phone]

## 2015-05-16 NOTE — Interval H&P Note (Signed)
History and Physical Interval Note:  05/16/2015 1:10 PM  Stephen Riddle  has presented today for surgery, with the diagnosis of CBD dilation and filling defects  The various methods of treatment have been discussed with the patient and family. After consideration of risks, benefits and other options for treatment, the patient has consented to  Procedure(s): ENDOSCOPIC RETROGRADE CHOLANGIOPANCREATOGRAPHY (ERCP) (N/A) as a surgical intervention .  The patient's history has been reviewed, patient examined, no change in status, stable for surgery.  I have reviewed the patient's chart and labs.  Questions were answered to the patient's satisfaction.     Rachael FeeJacobs, Dorrine Montone P

## 2015-05-16 NOTE — Op Note (Signed)
[facility] [address] [place], [id number]   ERCP PROCEDURE REPORT  PATIENT: Stephen JourneyChester, Stephen J  MR#: [id number] BIRTHDATE: 08/09/1975  GENDER: male ENDOSCOPIST: Rachael Feeaniel P Jacobs, MD PROCEDURE DATE:  05/16/2015 PROCEDURE:   ERCP with sphincterotomy/papillotomy and ERCP with removal of calculus/calculi INDICATIONS:remote lap chole for stone disease, recurrent abd pain with nausea, elevated liver tests and abnormal MRCP suggesting stones in bile duct.  MEDICATIONS: Per Anesthesia, indomethacin 100mg  PR, IV abx on floor  TOPICAL ANESTHETIC:   none  DESCRIPTION OF PROCEDURE:     Physical exam was performed.  Informed consent was obtained from the patient after explaining the benefits, risks, and alternatives to the procedure.  The patient was connected to the monitor and placed in the semi-prone position. IV medicine was administered through an indwelling cannula and oxygen via endotracheal tube.  After administration of sedation, the patients esophagus was intubated and the Pentax ERCP [id number] endoscope was advanced under direct visualization to the second portion of the duodenum without detailed examination of the UGI tract. Scout film showed several clips in the RUQ.  The major papilla was hypertrophied appearing, with a longer duodenal bulge than is typical.  The CBD was cannulated using a 44 Autotome over a .035 hydrawire and contrast was injected. Cholangiogram revealed dilated CBD, CHD (CBD up to 10mm) containing several mobile filling defects (up to 7-138mm across).  An adequate biliary sphincterotomy was performed and then the bile duct was swept several times with a biliary retrieval balloon.  Multiple white stones and angular stone fragments were delivered into the duodenum. There was no purulence. Completion, occlusion cholangiogram showed no persistent filling defects.   The scope was then completely withdrawn from the patient and the  procedure terminated.  The main pancreatic duct was never cannulated with wire or injected with dye.     COMPLICATIONS:    None  ENDOSCOPIC IMPRESSION: Choledocholithiasis (s/p remoted lap chole), treated with biliary sphincterotomy and balloon sweeping.  RECOMMENDATIONS: Return to med/surg floor. Observe at least overnight.  Would continue IV abx overnight and then if he looks well would be OK to change to 5 day oral course.    _______________________________ eSigned:  Rachael Feeaniel P Jacobs, MD 05/16/2015 2:52 PM

## 2015-05-16 NOTE — Anesthesia Postprocedure Evaluation (Signed)
  Anesthesia Post-op Note  Patient: Stephen JourneyJoseph J Iacobucci  Procedure(s) Performed: Procedure(s): ENDOSCOPIC RETROGRADE CHOLANGIOPANCREATOGRAPHY (ERCP) (N/A)  Patient Location: Endoscopy Unit  Anesthesia Type:General  Level of Consciousness: awake, alert , oriented and patient cooperative  Airway and Oxygen Therapy: Patient Spontanous Breathing and Patient connected to nasal cannula oxygen  Post-op Pain: none  Post-op Assessment: Post-op Vital signs reviewed, Patient's Cardiovascular Status Stable, Respiratory Function Stable, Patent Airway and No signs of Nausea or vomiting              Post-op Vital Signs: Reviewed and stable  Last Vitals:  Filed Vitals:   05/16/15 1451  BP: 129/77  Pulse: 81  Temp: 36.5 C  Resp:     Complications: No apparent anesthesia complications

## 2015-05-16 NOTE — Progress Notes (Signed)
Follow up pcp [facility]/23/15 [facility] [place]/u discharge instructions

## 2015-05-16 NOTE — Progress Notes (Signed)
Initial Nutrition Assessment  DOCUMENTATION CODES:   Not applicable  INTERVENTION:  Diet advancement per MD.   NUTRITION DIAGNOSIS:   Inadequate oral intake related to poor appetite as evidenced by per patient/family report.   GOAL:   Patient will meet greater than or equal to 90% of their needs   MONITOR:   Diet advancement, Labs, Skin, I & O's  REASON FOR ASSESSMENT:   Malnutrition Screening Tool    ASSESSMENT:  Stephen Riddle is a 39 y.o. male with PMH significant for cholecystectomy, who presents complaining of abdominal pain, epigastric and RUQ since couple of months ago. The abdominal pain is getting worse. It is aggravated by meals. He denies diarrhea, associated with nausea. He drinks alcohol on weekends.   [facility] Information I gathered came from his sister. She states he has had a poor appetite due to stomach/gallbladder issues for going on 2 years. States pt has loss a considerable amount of weight and used to be "much bigger" but did not have an exact number.  Did not know of N/V/D/C. Should see pt following surgery.    Diet Order:  Diet NPO time specified  Skin:  Reviewed, no issues  Last BM:  05/15/2015  Height:   Ht Readings from Last 1 Encounters:  05/15/15 5\' 11"  (1.803 m)    Weight:   Wt Readings from Last 1 Encounters:  05/15/15 149 lb 3.2 oz (67.677 kg)    Ideal Body Weight:  78.18 kg  BMI:  Body mass index is 20.82 kg/(m^2).  Estimated Nutritional Needs:   Kcal:  1900-2100  Protein:  70-80 grams  Fluid:  >/= 1.9L  EDUCATION NEEDS:   No education needs identified at this time  Stephen AnoWilliam M. Jessie Schrieber, MS, RD LDN After Hours/Weekend Pager [phone]

## 2015-05-16 NOTE — Transfer of Care (Signed)
Immediate Anesthesia Transfer of Care Note  Patient: Stephen Riddle  Procedure(s) Performed: Procedure(s): ENDOSCOPIC RETROGRADE CHOLANGIOPANCREATOGRAPHY (ERCP) (N/A)  Patient Location: Endoscopy Unit  Anesthesia Type:General  Level of Consciousness: awake, alert , oriented and patient cooperative  Airway & Oxygen Therapy: Patient Spontanous Breathing and Patient connected to nasal cannula oxygen  Post-op Assessment: Report given to RN, Post -op Vital signs reviewed and stable and Patient moving all extremities  Post vital signs: Reviewed and stable  Last Vitals:  Filed Vitals:   05/16/15 1451  BP: 129/77  Pulse: 81  Temp: 36.5 C  Resp:     Complications: No apparent anesthesia complications

## 2015-05-16 NOTE — Anesthesia Procedure Notes (Signed)
Procedure Name: Intubation Date/Time: 05/16/2015 1:42 PM Performed by: Darcey NoraJAMES, Arlys Scatena B Pre-anesthesia Checklist: Patient identified, Emergency Drugs available, Suction available and Patient being monitored Patient Re-evaluated:Patient Re-evaluated prior to inductionOxygen Delivery Method: Circle system utilized Preoxygenation: Pre-oxygenation with 100% oxygen Intubation Type: IV induction Ventilation: Mask ventilation without difficulty Laryngoscope Size: Mac and 4 Grade View: Grade I Tube type: Oral Tube size: 7.5 mm Number of attempts: 1 Airway Equipment and Method: Stylet Placement Confirmation: ETT inserted through vocal cords under direct vision,  positive ETCO2 and breath sounds checked- equal and bilateral Secured at: 22 (cm at teeth) cm Tube secured with: Tape Dental Injury: Teeth and Oropharynx as per pre-operative assessment

## 2015-05-16 NOTE — Anesthesia Preprocedure Evaluation (Addendum)
Anesthesia Evaluation  Patient identified by MRN, date of birth, ID band Patient awake    Reviewed: Allergy & Precautions, H&P , NPO status , Patient's Chart, lab work & pertinent test results  Airway Mallampati: II  TM Distance: >3 FB Neck ROM: Full    Dental no notable dental hx. (+) Teeth Intact, Dental Advisory Given, Missing   Pulmonary Current Smoker,    Pulmonary exam normal breath sounds clear to auscultation       Cardiovascular negative cardio ROS   Rhythm:Regular Rate:Normal     Neuro/Psych negative neurological ROS  negative psych ROS   GI/Hepatic Neg liver ROS, GERD  Medicated and Poorly Controlled,  Endo/Other  negative endocrine ROS  Renal/GU negative Renal ROS  negative genitourinary   Musculoskeletal   Abdominal   Peds  Hematology negative hematology ROS (+)   Anesthesia Other Findings goatee  Reproductive/Obstetrics negative OB ROS                           Anesthesia Physical Anesthesia Plan  ASA: II  Anesthesia Plan: General   Post-op Pain Management:    Induction: Intravenous  Airway Management Planned: Oral ETT  Additional Equipment:   Intra-op Plan:   Post-operative Plan: Extubation in OR  Informed Consent: I have reviewed the patients History and Physical, chart, labs and discussed the procedure including the risks, benefits and alternatives for the proposed anesthesia with the patient or authorized representative who has indicated his/her understanding and acceptance.   Dental advisory given  Plan Discussed with: CRNA  Anesthesia Plan Comments:         Anesthesia Quick Evaluation

## 2015-05-16 NOTE — Consult Note (Signed)
Referring Provider: EDP, Dr. Blinda Leatherwood Primary Care Physician:  No primary care provider on file. Primary Gastroenterologist:  None, unassigned  Reason for Consultation:  ? ERCP; elevated LFT's and CBD dilation  HPI: Stephen Riddle is a 39 y.o. male with PMH significant only for cholecystectomy approximately 16 years ago, who presented to [facility] ED complaining of abdominal pain, epigastric and RUQ intermittent for the past 6 months or so.  Feels normal and pain free between episodes.  Aggravated by eating and associated with nausea and belching but no other symptoms.  LFT's are elevated with total bili 32. Now 1.9, AST 433 now 190, ALT 293 now 261, and ALP 239 now 253.  Ultrasound showed the following:  IMPRESSION: Dilated common bile duct and intrahepatic bile ducts. Although this may be normal post cholecystectomy, a distal common bile duct lesion cannot be excluded. Consider MR of the abdomen with MRCP versus CT of the abdomen with IV contrast media.  Patient was admitted and MCRP showed the following:  IMPRESSION: 1. Imaging complicated by large number of cholecystectomy clips and motion artifact. I do suspect some filling defects in the common bile duct including a 0.8 cm in diameter filling defect with low T2 signal rim and high T2 signal center, and a similar but more elongated structure in the common hepatic duct extending to the common bile duct. These have an unusual appearance for stones but presumably represent gallstones. Blood products or sludge in the biliary tree seem possible but less likely. Notably, the intrahepatic and extrahepatic biliary dilatation is not appreciably worsened from multiple prior exams including 08/16/2003. Accordingly I do not believe these filling defects are necessarily obstructive. ERCP may be helpful in further evaluation.  All other labs are normal.  Viral Hep panel negative.  Patient is receiving IV Unasyn.  He currently feels better with minimal  pain.  History reviewed. No pertinent past medical history.  Past Surgical History  Procedure Laterality Date  . Cholecystectomy      Prior to Admission medications   Not on File    Current Facility-Administered Medications  Medication Dose Route Frequency Provider Last Rate Last Dose  . 0.9 %  sodium chloride infusion   Intravenous Continuous Alba Cory, MD 125 mL/hr at 05/15/15 2201    . acetaminophen (TYLENOL) tablet 650 mg  650 mg Oral Q6H PRN Belkys A Regalado, MD       Or  . acetaminophen (TYLENOL) suppository 650 mg  650 mg Rectal Q6H PRN Belkys A Regalado, MD      . alum & mag hydroxide-simeth (MAALOX/MYLANTA) 200-200-20 MG/5ML suspension 30 mL  30 mL Oral Q6H PRN Belkys A Regalado, MD      . Ampicillin-Sulbactam (UNASYN) 3 g in sodium chloride 0.9 % 100 mL IVPB  3 g Intravenous Q6H Dorethea Clan, RPH   3 g at 05/16/15 0348  . docusate sodium (COLACE) capsule 100 mg  100 mg Oral BID Belkys A Regalado, MD   100 mg at 05/15/15 2148  . feeding supplement (BOOST / RESOURCE BREEZE) liquid 1 Container  1 Container Oral TID BM Alba Cory, MD   1 Container at 05/15/15 2204  . morphine 2 MG/ML injection 1 mg  1 mg Intravenous Q4H PRN Belkys A Regalado, MD   1 mg at 05/15/15 2343  . ondansetron (ZOFRAN) tablet 4 mg  4 mg Oral Q6H PRN Belkys A Regalado, MD       Or  . ondansetron (ZOFRAN) injection 4  mg  4 mg Intravenous Q6H PRN Belkys A Regalado, MD      . oxyCODONE (Oxy IR/ROXICODONE) immediate release tablet 5 mg  5 mg Oral Q4H PRN Belkys A Regalado, MD      . pantoprazole (PROTONIX) injection 40 mg  40 mg Intravenous Q12H Belkys A Regalado, MD   40 mg at 05/15/15 2200    Allergies as of 05/15/2015  . (No Known Allergies)    History reviewed. No pertinent family history.  Social History   Social History  . Marital Status: Single    Spouse Name: N/A  . Number of Children: N/A  . Years of Education: N/A   Occupational History  . Not on file.   Social  History Main Topics  . Smoking status: Current Every Day Smoker -- 0.50 packs/day  . Smokeless tobacco: Not on file  . Alcohol Use: Yes  . Drug Use: Yes    Special: Marijuana  . Sexual Activity: Not on file   Other Topics Concern  . Not on file   Social History Narrative    Review of Systems: Ten point ROS is O/W negative except as mentioned in HPI.  Physical Exam: Vital signs in last 24 hours: Temp:  [97.6 F (36.4 C)-99 F (37.2 C)] 98.3 F (36.8 C) (10/14 0534) Pulse Rate:  [64-92] 78 (10/14 0534) Resp:  [17-20] 18 (10/14 0534) BP: (115-138)/(67-89) 115/67 mmHg (10/14 0534) SpO2:  [100 %] 100 % (10/14 0534) Weight:  [149 lb (67.586 kg)-149 lb 3.2 oz (67.677 kg)] 149 lb (67.586 kg) (10/13 2032) Last BM Date: 05/15/15 General:  Alert, Well-developed, well-nourished, pleasant and cooperative in NAD Head:  Normocephalic and atraumatic. Eyes:  Sclera clear, no icterus.  Conjunctiva pink. Ears:  Normal auditory acuity. Mouth:  No deformity or lesions.   Lungs:  Clear throughout to auscultation.  No wheezes, crackles, or rhonchi.  Heart:  Regular rate and rhythm; no murmurs, clicks, rubs, or gallops. Abdomen:  Soft, non-distended.  BS present.  Minimal epigastric TTP currently.  Rectal:  Deferred  Msk:  Symmetrical without gross deformities. Pulses:  Normal pulses noted. Extremities:  Without clubbing or edema. Neurologic:  Alert and oriented x 4;  grossly normal neurologically. Skin:  Intact without significant lesions or rashes. Psych:  Alert and cooperative. Normal mood and affect.  Lab Results:  Recent Labs  05/15/15 1156 05/16/15 0813  WBC 12.9* 7.7  HGB 16.6 14.3  HCT 48.8 41.9  PLT 225 177   BMET  Recent Labs  05/15/15 1156 05/16/15 0813  NA 139 PENDING  K 4.9 PENDING  CL 105 PENDING  CO2 26 PENDING  GLUCOSE 91 94  BUN 8 7  CREATININE 0.83 0.69  CALCIUM 9.4 PENDING   LFT  Recent Labs  05/16/15 0813  PROT 6.4*  ALBUMIN 3.4*  AST 190*   ALT 261*  ALKPHOS 253*  BILITOT 1.9*   PT/INR  Recent Labs  05/15/15 1500  LABPROT 12.7  INR 0.93   Hepatitis Panel  Recent Labs  05/15/15 1500  HEPBSAG Negative  HCVAB <0.1  HEPAIGM Negative  HEPBIGM Negative   Studies/Results: Mr 3d Recon At Scanner  05/16/2015  CLINICAL DATA:  Abdominal pain including the epigastric and right upper quadrant regions, worsening, aggravated by meals. Prior cholecystectomy. Dilated biliary tree on ultrasound EXAM: MRI ABDOMEN WITHOUT AND WITH CONTRAST (INCLUDING MRCP) TECHNIQUE: Multiplanar multisequence MR imaging of the abdomen was performed both before and after the administration of intravenous contrast. Heavily T2-weighted images of the  biliary and pancreatic ducts were obtained, and three-dimensional MRCP images were rendered by post processing. Delayed post-contrast images were not performed due to the patient becoming nauseated. CONTRAST:  14mL MULTIHANCE GADOBENATE DIMEGLUMINE 529 MG/ML IV SOLN COMPARISON:  05/15/2015 FINDINGS: Despite efforts by the technologist and patient, motion artifact is present on today's exam and could not be eliminated. This reduces exam sensitivity and specificity. Lower chest:  Unremarkable Hepatobiliary: Assessment is complicated by the presence of 7 or 8 large metal clips along the common hepatic duct and proximal common bile duct which introduce extensive artifact, in addition to the fairly severe motion artifact on the dedicated MRCP sequence. There is a 3 cm segment of the biliary tree which is rendered essentially uninterpretable ; this would probably require ERCP to be able to visualize better. That said, the intrahepatic and mild extrahepatic biliary dilatation seems to be chronic. Looking at a CT of the abdomen from 08/16/2003, which is also post cholecystectomy, there is a similar degree of intrahepatic biliary dilatation, in the common bile duct measures 8 mm on that study compared to 9 mm today (measured on  image 25, series 12 PICC). I do not observe abnormal enhancement along the biliary system to suggest cholangitis. On some of the coronal images, including image 8 of series 13 and image 23 of series 15, there do appear to be filling defects within the CBD with low T2 signal periphery and higher T2 signal centrally, 1 measuring 1.1 cm distally on image 23 of series 15 and another potentially measuring up to 3.0 by 0.8 cm on image 7 of series 13. Images 6-8 of series 13 seemed show these best. There is no associated abnormal enhancement. No abnormal focal enhancement in the liver. Pancreas: Unremarkable Spleen: Unremarkable Adrenals/Urinary Tract: Unremarkable Stomach/Bowel: Unremarkable Vascular/Lymphatic: Unremarkable Other: No supplemental non-categorized findings. Musculoskeletal: Unremarkable IMPRESSION: 1. Imaging complicated by large number of cholecystectomy clips and motion artifact. I do suspect some filling defects in the common bile duct including a 0.8 cm in diameter filling defect with low T2 signal rim and high T2 signal center, and a similar but more elongated structure in the common hepatic duct extending to the common bile duct. These have an unusual appearance for stones but presumably represent gallstones. Blood products or sludge in the biliary tree seem possible but less likely. Notably, the intrahepatic and extrahepatic biliary dilatation is not appreciably worsened from multiple prior exams including 08/16/2003. Accordingly I do not believe these filling defects are necessarily obstructive. ERCP may be helpful in further evaluation. Electronically Signed   By: Gaylyn RongWalter  Liebkemann M.D.   On: 05/16/2015 08:39   Mr Roe Coombsbd W/wo Cm/mrcp  05/16/2015  CLINICAL DATA:  Abdominal pain including the epigastric and right upper quadrant regions, worsening, aggravated by meals. Prior cholecystectomy. Dilated biliary tree on ultrasound EXAM: MRI ABDOMEN WITHOUT AND WITH CONTRAST (INCLUDING MRCP) TECHNIQUE:  Multiplanar multisequence MR imaging of the abdomen was performed both before and after the administration of intravenous contrast. Heavily T2-weighted images of the biliary and pancreatic ducts were obtained, and three-dimensional MRCP images were rendered by post processing. Delayed post-contrast images were not performed due to the patient becoming nauseated. CONTRAST:  14mL MULTIHANCE GADOBENATE DIMEGLUMINE 529 MG/ML IV SOLN COMPARISON:  05/15/2015 FINDINGS: Despite efforts by the technologist and patient, motion artifact is present on today's exam and could not be eliminated. This reduces exam sensitivity and specificity. Lower chest:  Unremarkable Hepatobiliary: Assessment is complicated by the presence of 7 or 8 large metal clips  along the common hepatic duct and proximal common bile duct which introduce extensive artifact, in addition to the fairly severe motion artifact on the dedicated MRCP sequence. There is a 3 cm segment of the biliary tree which is rendered essentially uninterpretable ; this would probably require ERCP to be able to visualize better. That said, the intrahepatic and mild extrahepatic biliary dilatation seems to be chronic. Looking at a CT of the abdomen from 08/16/2003, which is also post cholecystectomy, there is a similar degree of intrahepatic biliary dilatation, in the common bile duct measures 8 mm on that study compared to 9 mm today (measured on image 25, series 12 PICC). I do not observe abnormal enhancement along the biliary system to suggest cholangitis. On some of the coronal images, including image 8 of series 13 and image 23 of series 15, there do appear to be filling defects within the CBD with low T2 signal periphery and higher T2 signal centrally, 1 measuring 1.1 cm distally on image 23 of series 15 and another potentially measuring up to 3.0 by 0.8 cm on image 7 of series 13. Images 6-8 of series 13 seemed show these best. There is no associated abnormal enhancement. No  abnormal focal enhancement in the liver. Pancreas: Unremarkable Spleen: Unremarkable Adrenals/Urinary Tract: Unremarkable Stomach/Bowel: Unremarkable Vascular/Lymphatic: Unremarkable Other: No supplemental non-categorized findings. Musculoskeletal: Unremarkable IMPRESSION: 1. Imaging complicated by large number of cholecystectomy clips and motion artifact. I do suspect some filling defects in the common bile duct including a 0.8 cm in diameter filling defect with low T2 signal rim and high T2 signal center, and a similar but more elongated structure in the common hepatic duct extending to the common bile duct. These have an unusual appearance for stones but presumably represent gallstones. Blood products or sludge in the biliary tree seem possible but less likely. Notably, the intrahepatic and extrahepatic biliary dilatation is not appreciably worsened from multiple prior exams including 08/16/2003. Accordingly I do not believe these filling defects are necessarily obstructive. ERCP may be helpful in further evaluation. Electronically Signed   By: Gaylyn Rong M.D.   On: 05/16/2015 08:39   US Abdomen Limited Ruq  05/15/2015  CLINICAL DATA:  Elevated liver function tests, abdominal pain for 6 months, prior cholecystectomy EXAM: US ABDOMEN LIMITED - RIGHT UPPER QUADRANT COMPARISON:  CT abdomen pelvis of 08/19/2003 FINDINGS: Gallbladder: The gallbladder has previously been resected. Common bile duct: Diameter: The common bile duct is dilated measuring 15 mm in diameter, possibly normal post cholecystectomy. If further assessment is warranted CT of the abdomen with IV contrast media is recommended versus MR with MRCP. Liver: The liver has a normal echogenic pattern. Intrahepatic ducts are noted to be prominent, and as noted above CT or MRI is recommended to assess for a possible distal common bile duct abnormality. And echogenic focus in the left lobe may represent calcified granuloma. IMPRESSION: Dilated  common bile duct and intrahepatic bile ducts. Although this may be normal post cholecystectomy, a distal common bile duct lesion cannot be excluded. Consider MR of the abdomen with MRCP versus CT of the abdomen with IV contrast media. Electronically Signed   By: Dwyane Dee M.D.   On: 05/15/2015 16:46   IMPRESSION:  -39 year old male with intermittent upper abdominal pain x 6 months with elevated LFT's, CBD dilation to 15 mm, and filling defects seen on MRCP:  LFT's slightly improved this AM.  Suspect CBD stones with partial biliary obstruction.  PLAN: -ERCP at [facility] later  today (1330).  Patient will be transferred there for procedure and back again.  Receiving IV Unasyn.  Post-procedure indomethacin ordered.  Briani Maul D.  05/16/2015, 9:09 AM  Pager number [phone]

## 2015-05-16 NOTE — Progress Notes (Signed)
[facility] PROGRESS NOTE  Stephen Riddle EXB:[id number] DOB: [birth year] DOA: 05/15/2015 PCP: No primary care provider on file.  Assessment/Plan: 1-RUQ, epigastric pain with transaminases: suspect obstructive cholestatic. RUQ [place] with dilated Common bile duct and intra hepatic bile ducts. A distal common bile duct is not excluded. Patient presents with elevated LFT and bilirubin.  IV antibiotics due to leukocytosis day  MRCP probably filling defect in the common bile duct GI has been consulted, plan for ERCP today IV fluids, IV protonix. IV pain medications.   2-Transaminases: Viral hepatitis panel negative.  Also might be related to history of alcohol use and number one.   3-Screening for HIV negative  Code Status: Full Code Family Communication: care discussed with patient.  Disposition Plan: remain inpatient.    Consultants:  GI  Procedures:  ERCP  Antibiotics:  Unasyn  HPI/Subjective: Feeling better, abdominal pain better  Objective: Filed Vitals:   2015/05/29 1144  BP: 133/85  Pulse: 58  Temp: 98.1 F (36.7 C)  Resp: 11   No intake or output data in the 24 hours ending 05-29-15 1200 Filed Weights   05/15/15 1852  Weight: 67.677 kg (149 lb 3.2 oz)    Exam:   General:  Alert in no distress  Cardiovascular: S 1, S 2 RRR  Respiratory: CTA  Abdomen: BS present soft. tenderness  Musculoskeletal: no edema  Data Reviewed: Basic Metabolic Panel:  Recent Labs Lab 05/15/15 1156 05-29-2015 0813  NA 139 136  K 4.9 3.8  CL 105 108  CO2 26 22  GLUCOSE 91 94  BUN 8 7  CREATININE 0.83 0.69  CALCIUM 9.4 8.3*   Liver Function Tests:  Recent Labs Lab 05/15/15 1156 05/29/15 0813  AST 433* 190*  ALT 293* 261*  ALKPHOS 239* 253*  BILITOT 3.2* 1.9*  PROT 8.0 6.4*  ALBUMIN 4.2 3.4*    Recent Labs Lab 05/15/15 1156  LIPASE 27   No results for input(s): AMMONIA in the last 168 hours. CBC:  Recent Labs Lab 05/15/15 1156  2015/05/29 0813  WBC 12.9* 7.7  HGB 16.6 14.3  HCT 48.8 41.9  MCV 87.9 87.3  PLT 225 177   Cardiac Enzymes: No results for input(s): CKTOTAL, CKMB, CKMBINDEX, TROPONINI in the last 168 hours. BNP (last 3 results) No results for input(s): BNP in the last 8760 hours.  ProBNP (last 3 results) No results for input(s): PROBNP in the last 8760 hours.  CBG: No results for input(s): GLUCAP in the last 168 hours.  No results found for this or any previous visit (from the past 240 hour(s)).   Studies: Mr 3d Recon At Scanner  05-29-15  CLINICAL DATA:  Abdominal pain including the epigastric and right upper quadrant regions, worsening, aggravated by meals. Prior cholecystectomy. Dilated biliary tree on ultrasound EXAM: MRI ABDOMEN WITHOUT AND WITH CONTRAST (INCLUDING MRCP) TECHNIQUE: Multiplanar multisequence MR imaging of the abdomen was performed both before and after the administration of intravenous contrast. Heavily T2-weighted images of the biliary and pancreatic ducts were obtained, and three-dimensional MRCP images were rendered by post processing. Delayed post-contrast images were not performed due to the patient becoming nauseated. CONTRAST:  14mL MULTIHANCE GADOBENATE DIMEGLUMINE 529 MG/ML IV SOLN COMPARISON:  05/15/2015 FINDINGS: Despite efforts by the technologist and patient, motion artifact is present on today's exam and could not be eliminated. This reduces exam sensitivity and specificity. Lower chest:  Unremarkable Hepatobiliary: Assessment is complicated by the presence of 7 or 8 large metal clips along the common hepatic  duct and proximal common bile duct which introduce extensive artifact, in addition to the fairly severe motion artifact on the dedicated MRCP sequence. There is a 3 cm segment of the biliary tree which is rendered essentially uninterpretable ; this would probably require ERCP to be able to visualize better. That said, the intrahepatic and mild extrahepatic biliary  dilatation seems to be chronic. Looking at a CT of the abdomen from 08/16/2003, which is also post cholecystectomy, there is a similar degree of intrahepatic biliary dilatation, in the common bile duct measures 8 mm on that study compared to 9 mm today (measured on image 25, series 12 PICC). I do not observe abnormal enhancement along the biliary system to suggest cholangitis. On some of the coronal images, including image 8 of series 13 and image 23 of series 15, there do appear to be filling defects within the CBD with low T2 signal periphery and higher T2 signal centrally, 1 measuring 1.1 cm distally on image 23 of series 15 and another potentially measuring up to 3.0 by 0.8 cm on image 7 of series 13. Images 6-8 of series 13 seemed show these best. There is no associated abnormal enhancement. No abnormal focal enhancement in the liver. Pancreas: Unremarkable Spleen: Unremarkable Adrenals/Urinary Tract: Unremarkable Stomach/Bowel: Unremarkable Vascular/Lymphatic: Unremarkable Other: No supplemental non-categorized findings. Musculoskeletal: Unremarkable IMPRESSION: 1. Imaging complicated by large number of cholecystectomy clips and motion artifact. I do suspect some filling defects in the common bile duct including a 0.8 cm in diameter filling defect with low T2 signal rim and high T2 signal center, and a similar but more elongated structure in the common hepatic duct extending to the common bile duct. These have an unusual appearance for stones but presumably represent gallstones. Blood products or sludge in the biliary tree seem possible but less likely. Notably, the intrahepatic and extrahepatic biliary dilatation is not appreciably worsened from multiple prior exams including 08/16/2003. Accordingly I do not believe these filling defects are necessarily obstructive. ERCP may be helpful in further evaluation. Electronically Signed   By: Gaylyn Rong M.D.   On: 05/16/2015 08:39   Mr Roe Coombs W/wo  Cm/mrcp  05/16/2015  CLINICAL DATA:  Abdominal pain including the epigastric and right upper quadrant regions, worsening, aggravated by meals. Prior cholecystectomy. Dilated biliary tree on ultrasound EXAM: MRI ABDOMEN WITHOUT AND WITH CONTRAST (INCLUDING MRCP) TECHNIQUE: Multiplanar multisequence MR imaging of the abdomen was performed both before and after the administration of intravenous contrast. Heavily T2-weighted images of the biliary and pancreatic ducts were obtained, and three-dimensional MRCP images were rendered by post processing. Delayed post-contrast images were not performed due to the patient becoming nauseated. CONTRAST:  14mL MULTIHANCE GADOBENATE DIMEGLUMINE 529 MG/ML IV SOLN COMPARISON:  05/15/2015 FINDINGS: Despite efforts by the technologist and patient, motion artifact is present on today's exam and could not be eliminated. This reduces exam sensitivity and specificity. Lower chest:  Unremarkable Hepatobiliary: Assessment is complicated by the presence of 7 or 8 large metal clips along the common hepatic duct and proximal common bile duct which introduce extensive artifact, in addition to the fairly severe motion artifact on the dedicated MRCP sequence. There is a 3 cm segment of the biliary tree which is rendered essentially uninterpretable ; this would probably require ERCP to be able to visualize better. That said, the intrahepatic and mild extrahepatic biliary dilatation seems to be chronic. Looking at a CT of the abdomen from 08/16/2003, which is also post cholecystectomy, there is a similar degree of intrahepatic  biliary dilatation, in the common bile duct measures 8 mm on that study compared to 9 mm today (measured on image 25, series 12 PICC). I do not observe abnormal enhancement along the biliary system to suggest cholangitis. On some of the coronal images, including image 8 of series 13 and image 23 of series 15, there do appear to be filling defects within the CBD with low T2  signal periphery and higher T2 signal centrally, 1 measuring 1.1 cm distally on image 23 of series 15 and another potentially measuring up to 3.0 by 0.8 cm on image 7 of series 13. Images 6-8 of series 13 seemed show these best. There is no associated abnormal enhancement. No abnormal focal enhancement in the liver. Pancreas: Unremarkable Spleen: Unremarkable Adrenals/Urinary Tract: Unremarkable Stomach/Bowel: Unremarkable Vascular/Lymphatic: Unremarkable Other: No supplemental non-categorized findings. Musculoskeletal: Unremarkable IMPRESSION: 1. Imaging complicated by large number of cholecystectomy clips and motion artifact. I do suspect some filling defects in the common bile duct including a 0.8 cm in diameter filling defect with low T2 signal rim and high T2 signal center, and a similar but more elongated structure in the common hepatic duct extending to the common bile duct. These have an unusual appearance for stones but presumably represent gallstones. Blood products or sludge in the biliary tree seem possible but less likely. Notably, the intrahepatic and extrahepatic biliary dilatation is not appreciably worsened from multiple prior exams including 08/16/2003. Accordingly I do not believe these filling defects are necessarily obstructive. ERCP may be helpful in further evaluation. Electronically Signed   By: Gaylyn Rong M.D.   On: 05/16/2015 08:39   US Abdomen Limited Ruq  05/15/2015  CLINICAL DATA:  Elevated liver function tests, abdominal pain for 6 months, prior cholecystectomy EXAM: US ABDOMEN LIMITED - RIGHT UPPER QUADRANT COMPARISON:  CT abdomen pelvis of 08/19/2003 FINDINGS: Gallbladder: The gallbladder has previously been resected. Common bile duct: Diameter: The common bile duct is dilated measuring 15 mm in diameter, possibly normal post cholecystectomy. If further assessment is warranted CT of the abdomen with IV contrast media is recommended versus MR with MRCP. Liver: The liver has  a normal echogenic pattern. Intrahepatic ducts are noted to be prominent, and as noted above CT or MRI is recommended to assess for a possible distal common bile duct abnormality. And echogenic focus in the left lobe may represent calcified granuloma. IMPRESSION: Dilated common bile duct and intrahepatic bile ducts. Although this may be normal post cholecystectomy, a distal common bile duct lesion cannot be excluded. Consider MR of the abdomen with MRCP versus CT of the abdomen with IV contrast media. Electronically Signed   By: Dwyane Dee M.D.   On: 05/15/2015 16:46    Scheduled Meds: . ampicillin-sulbactam (UNASYN) IV  3 g Intravenous Q6H  . docusate sodium  100 mg Oral BID  . feeding supplement  1 Container Oral TID BM  . indomethacin  100 mg Rectal Once  . pantoprazole (PROTONIX) IV  40 mg Intravenous Q12H   Continuous Infusions: . sodium chloride 125 mL/hr at 05/16/15 1000  . sodium chloride      Active Problems:   Abdominal pain   Abnormal transaminases    Time spent: 35 minutes    Allia Wiltsey A  [facility] Pager [phone]. If 7PM-7AM, please contact night-coverage at [email or web address], password [facility] 05/16/2015, 12:00 PM  LOS: 1 day

## 2015-05-16 NOTE — Progress Notes (Addendum)
Pt had an appt for pcp follow up today but ED Cm [facility] who will reschedule the appt after ED Cm noted pt with admission to hospital. [facility] will contact pt with new pcp f/u appt info  Cm will update EPIC if new appt shared with CM

## 2015-05-17 LAB — COMPREHENSIVE METABOLIC PANEL
ALT: 172 U/L — AB (ref 17–63)
AST: 72 U/L — AB (ref 15–41)
Albumin: 3.2 g/dL — ABNORMAL LOW (ref 3.5–5.0)
Alkaline Phosphatase: 208 U/L — ABNORMAL HIGH (ref 38–126)
Anion gap: 6 (ref 5–15)
BUN: 6 mg/dL (ref 6–20)
CHLORIDE: 107 mmol/L (ref 101–111)
CO2: 23 mmol/L (ref 22–32)
CREATININE: 0.72 mg/dL (ref 0.61–1.24)
Calcium: 8.5 mg/dL — ABNORMAL LOW (ref 8.9–10.3)
GFR calc Af Amer: >60 mL/min (ref >60)
Glucose, Bld: 95 mg/dL (ref 65–99)
Potassium: 3.7 mmol/L (ref 3.5–5.1)
SODIUM: 136 mmol/L (ref 135–145)
Total Bilirubin: 1.2 mg/dL (ref 0.3–1.2)
Total Protein: 6.1 g/dL — ABNORMAL LOW (ref 6.5–8.1)

## 2015-05-17 MED ORDER — DOCUSATE SODIUM 100 MG PO CAPS: 100.0000 mg | ORAL_CAPSULE | Freq: Two times a day (BID) | ORAL | Status: DC

## 2015-05-17 MED ORDER — OXYCODONE HCL 5 MG PO TABS: 5.0000 mg | ORAL_TABLET | ORAL | Status: DC | PRN

## 2015-05-17 MED ORDER — BOOST / RESOURCE BREEZE PO LIQD: 1.0000 | Freq: Three times a day (TID) | ORAL | Status: DC

## 2015-05-17 MED ORDER — AMOXICILLIN-POT CLAVULANATE 875-125 MG PO TABS: 1.0000 | ORAL_TABLET | Freq: Two times a day (BID) | ORAL | Status: DC

## 2015-05-17 MED ORDER — PANTOPRAZOLE SODIUM 40 MG PO TBEC: 40.0000 mg | DELAYED_RELEASE_TABLET | Freq: Every day | ORAL | Status: DC

## 2015-05-17 NOTE — Care Management Note (Signed)
Case Management Note  Patient Details  Name: Margreta JourneyJoseph J Jakubek MRN: [id number] Date of Birth: [birth year]  Subjective/Objective:  S/p ERCP with sphincterotomy                   Action/Plan: NCM spoke to pt and provided Goodrx discount cards for Augmentin, and Protonix. Pt reports having a [employer] Card. Explained he can get abx from [employer] at $10 and Protonix $7. Pt reports having [facility] orange card. Pharmacy that utilize card is not open today.   Expected Discharge Date:   (unknown)               Expected Discharge Plan:  Home/Self Care  In-House Referral:     Discharge planning Services  CM Consult, Medication Assistance   Status of Service:  Completed, signed off  Medicare Important Message Given:    Date Medicare IM Given:    Medicare IM give by:    Date Additional Medicare IM Given:    Additional Medicare Important Message give by:     If discussed at Long Length of Stay Meetings, dates discussed:    Additional Comments:  Elliot CousinShavis, Yosselin Zoeller Ellen, RN 05/17/2015, 12:21 PM

## 2015-05-17 NOTE — Progress Notes (Signed)
Nursing Discharge Summary  Patient ID: Stephen JourneyJoseph J Riddle MRN: [id number] DOB/AGE: 1976/04/09 39 y.o.  Admit date: 05/15/2015 Discharge date: 05/17/2015  Discharged Condition: good  Disposition: 01-Home or Self Care  Follow-up Information    Follow up with Family medicine at [place] . Go on 05/23/2015.   Why:  You have been scheduled an appointment to see Mitzie NaDonna Odom on 05/23/15 at 11:15 am    Contact information:   [address]  [place]  [phone]       Follow up with [facility].   Specialty:  Gastroenterology   Why:  liver problem   Contact information:   [address] [place] [phone]      Prescriptions Given: Patient given prescriptions and discount card from case management for assistance.  Patient discharge instructions and medications discussed.  Patient and significant other verbalized understanding without further questions   Means of Discharge: Patient to be discharged home via private vehicle.  Patient wishes to ambulate downstairs.    Signed: Gloriajean DellBaldwin, Iceis Knab Danielle 05/17/2015, 12:06 PM

## 2015-05-17 NOTE — Progress Notes (Signed)
[facility] Progress Note  Subjective:  Feels great.  No pain.  Tolerated breakfast.  LFT's trending down.  Objective:  Vital signs in last 24 hours: Temp:  [97.7 F (36.5 C)-98.1 F (36.7 C)] 97.9 F (36.6 C) (10/15 0535) Pulse Rate:  [55-81] 61 (10/15 0535) Resp:  [11-18] 16 (10/15 0535) BP: (106-153)/(70-85) 111/70 mmHg (10/15 0535) SpO2:  [98 %-100 %] 100 % (10/15 0535) Last BM Date: 05/15/15 General:  Alert, Well-developed, in NAD Heart:  Regular rate and rhythm; no murmurs Pulm:  CTAB.  No W/R/R. Abdomen:  Soft, non-distended. Normal bowel sounds.  Non-tender. Extremities:  Without edema. Neurologic:  Alert and  oriented x4;  grossly normal neurologically. Psych:  Alert and cooperative. Normal mood and affect.  Intake/Output from previous day: 10/14 0701 - 10/15 0700 In: 1940 [P.O.:740; I.V.:1200] Out: 100 [Urine:100] Intake/Output this shift:    Lab Results:  Recent Labs  05/15/15 1156 05/16/15 0813  WBC 12.9* 7.7  HGB 16.6 14.3  HCT 48.8 41.9  PLT 225 177   BMET  Recent Labs  05/15/15 1156 05/16/15 0813 05/17/15 0545  NA 139 136 136  K 4.9 3.8 3.7  CL 105 108 107  CO2 GLUCOSE 91 94 95  BUN CREATININE 0.83 0.69 0.72  CALCIUM 9.4 8.3* 8.5*   LFT  Recent Labs  05/17/15 0545  PROT 6.1*  ALBUMIN 3.2*  AST 72*  ALT 172*  ALKPHOS 208*  BILITOT 1.2   PT/INR  Recent Labs  05/15/15 1500  LABPROT 12.7  INR 0.93   Hepatitis Panel  Recent Labs  05/15/15 1500  HEPBSAG Negative  HCVAB <0.1  HEPAIGM Negative  HEPBIGM Negative    Mr 3d Recon At Scanner  05/16/2015  CLINICAL DATA:  Abdominal pain including the epigastric and right upper quadrant regions, worsening, aggravated by meals. Prior cholecystectomy. Dilated biliary tree on ultrasound EXAM: MRI ABDOMEN WITHOUT AND WITH CONTRAST (INCLUDING MRCP) TECHNIQUE: Multiplanar multisequence MR imaging of the abdomen was performed both before and after  the administration of intravenous contrast. Heavily T2-weighted images of the biliary and pancreatic ducts were obtained, and three-dimensional MRCP images were rendered by post processing. Delayed post-contrast images were not performed due to the patient becoming nauseated. CONTRAST:  14mL MULTIHANCE GADOBENATE DIMEGLUMINE 529 MG/ML IV SOLN COMPARISON:  05/15/2015 FINDINGS: Despite efforts by the technologist and patient, motion artifact is present on today's exam and could not be eliminated. This reduces exam sensitivity and specificity. Lower chest:  Unremarkable Hepatobiliary: Assessment is complicated by the presence of 7 or 8 large metal clips along the common hepatic duct and proximal common bile duct which introduce extensive artifact, in addition to the fairly severe motion artifact on the dedicated MRCP sequence. There is a 3 cm segment of the biliary tree which is rendered essentially uninterpretable ; this would probably require ERCP to be able to visualize better. That said, the intrahepatic and mild extrahepatic biliary dilatation seems to be chronic. Looking at a CT of the abdomen from 08/16/2003, which is also post cholecystectomy, there is a similar degree of intrahepatic biliary dilatation, in the common bile duct measures 8 mm on that study compared to 9 mm today (measured on image 25, series 12 PICC). I do not observe abnormal enhancement along the biliary system to suggest cholangitis. On some of the coronal images, including image 8 of series 13 and image 23 of series 15, there do appear to be filling  defects within the CBD with low T2 signal periphery and higher T2 signal centrally, 1 measuring 1.1 cm distally on image 23 of series 15 and another potentially measuring up to 3.0 by 0.8 cm on image 7 of series 13. Images 6-8 of series 13 seemed show these best. There is no associated abnormal enhancement. No abnormal focal enhancement in the liver. Pancreas: Unremarkable Spleen: Unremarkable  Adrenals/Urinary Tract: Unremarkable Stomach/Bowel: Unremarkable Vascular/Lymphatic: Unremarkable Other: No supplemental non-categorized findings. Musculoskeletal: Unremarkable IMPRESSION: 1. Imaging complicated by large number of cholecystectomy clips and motion artifact. I do suspect some filling defects in the common bile duct including a 0.8 cm in diameter filling defect with low T2 signal rim and high T2 signal center, and a similar but more elongated structure in the common hepatic duct extending to the common bile duct. These have an unusual appearance for stones but presumably represent gallstones. Blood products or sludge in the biliary tree seem possible but less likely. Notably, the intrahepatic and extrahepatic biliary dilatation is not appreciably worsened from multiple prior exams including 08/16/2003. Accordingly I do not believe these filling defects are necessarily obstructive. ERCP may be helpful in further evaluation. Electronically Signed   By: Gaylyn RongWalter  Liebkemann M.D.   On: 05/16/2015 08:39   Dg Ercp Biliary & Pancreatic Ducts  05/16/2015  CLINICAL DATA:  Common bile duct calculi. EXAM: ERCP TECHNIQUE: Multiple spot images obtained with the fluoroscopic device and submitted for interpretation post-procedure. FLUOROSCOPY TIME:  If the device does not provide the exposure index: Fluoroscopy Time:  8 minutes and 58 seconds. Number of Acquired Images:  10 COMPARISON:  MRI 05/15/2015 FINDINGS: Cholangiogram demonstrates filling defects in the extrahepatic biliary ducts. Wire was advanced into the intrahepatic ducts. Balloon was used for stone removal. Poor opacification of the biliary system on final image. IMPRESSION: Choledocholithiasis and stone retrieval. These images were submitted for radiologic interpretation only. Please see the procedural report for the amount of contrast and the fluoroscopy time utilized. Electronically Signed   By: Richarda OverlieAdam  Henn M.D.   On: 05/16/2015 15:23   Mr Roe Coombsbd W/wo  Cm/mrcp  05/16/2015  CLINICAL DATA:  Abdominal pain including the epigastric and right upper quadrant regions, worsening, aggravated by meals. Prior cholecystectomy. Dilated biliary tree on ultrasound EXAM: MRI ABDOMEN WITHOUT AND WITH CONTRAST (INCLUDING MRCP) TECHNIQUE: Multiplanar multisequence MR imaging of the abdomen was performed both before and after the administration of intravenous contrast. Heavily T2-weighted images of the biliary and pancreatic ducts were obtained, and three-dimensional MRCP images were rendered by post processing. Delayed post-contrast images were not performed due to the patient becoming nauseated. CONTRAST:  14mL MULTIHANCE GADOBENATE DIMEGLUMINE 529 MG/ML IV SOLN COMPARISON:  05/15/2015 FINDINGS: Despite efforts by the technologist and patient, motion artifact is present on today's exam and could not be eliminated. This reduces exam sensitivity and specificity. Lower chest:  Unremarkable Hepatobiliary: Assessment is complicated by the presence of 7 or 8 large metal clips along the common hepatic duct and proximal common bile duct which introduce extensive artifact, in addition to the fairly severe motion artifact on the dedicated MRCP sequence. There is a 3 cm segment of the biliary tree which is rendered essentially uninterpretable ; this would probably require ERCP to be able to visualize better. That said, the intrahepatic and mild extrahepatic biliary dilatation seems to be chronic. Looking at a CT of the abdomen from 08/16/2003, which is also post cholecystectomy, there is a similar degree of intrahepatic biliary dilatation, in the common bile duct measures  8 mm on that study compared to 9 mm today (measured on image 25, series 12 PICC). I do not observe abnormal enhancement along the biliary system to suggest cholangitis. On some of the coronal images, including image 8 of series 13 and image 23 of series 15, there do appear to be filling defects within the CBD with low T2  signal periphery and higher T2 signal centrally, 1 measuring 1.1 cm distally on image 23 of series 15 and another potentially measuring up to 3.0 by 0.8 cm on image 7 of series 13. Images 6-8 of series 13 seemed show these best. There is no associated abnormal enhancement. No abnormal focal enhancement in the liver. Pancreas: Unremarkable Spleen: Unremarkable Adrenals/Urinary Tract: Unremarkable Stomach/Bowel: Unremarkable Vascular/Lymphatic: Unremarkable Other: No supplemental non-categorized findings. Musculoskeletal: Unremarkable IMPRESSION: 1. Imaging complicated by large number of cholecystectomy clips and motion artifact. I do suspect some filling defects in the common bile duct including a 0.8 cm in diameter filling defect with low T2 signal rim and high T2 signal center, and a similar but more elongated structure in the common hepatic duct extending to the common bile duct. These have an unusual appearance for stones but presumably represent gallstones. Blood products or sludge in the biliary tree seem possible but less likely. Notably, the intrahepatic and extrahepatic biliary dilatation is not appreciably worsened from multiple prior exams including 08/16/2003. Accordingly I do not believe these filling defects are necessarily obstructive. ERCP may be helpful in further evaluation. Electronically Signed   By: Gaylyn Rong M.D.   On: 05/16/2015 08:39   US Abdomen Limited Ruq  05/15/2015  CLINICAL DATA:  Elevated liver function tests, abdominal pain for 6 months, prior cholecystectomy EXAM: US ABDOMEN LIMITED - RIGHT UPPER QUADRANT COMPARISON:  CT abdomen pelvis of 08/19/2003 FINDINGS: Gallbladder: The gallbladder has previously been resected. Common bile duct: Diameter: The common bile duct is dilated measuring 15 mm in diameter, possibly normal post cholecystectomy. If further assessment is warranted CT of the abdomen with IV contrast media is recommended versus MR with MRCP. Liver: The liver has  a normal echogenic pattern. Intrahepatic ducts are noted to be prominent, and as noted above CT or MRI is recommended to assess for a possible distal common bile duct abnormality. And echogenic focus in the left lobe may represent calcified granuloma. IMPRESSION: Dilated common bile duct and intrahepatic bile ducts. Although this may be normal post cholecystectomy, a distal common bile duct lesion cannot be excluded. Consider MR of the abdomen with MRCP versus CT of the abdomen with IV contrast media. Electronically Signed   By: Dwyane Dee M.D.   On: 05/15/2015 16:46    Assessment / Plan: *Choledocholithiasis:  S/p ERCP with sphincterotomy and balloon sweep with stone extraction on 10/14.  LFT's trending down and patient feels well.  Ok for discharge today with 5 day course of cipro.  Does not need GI follow-up; LFT's can be followed to normal by PCP.   LOS: 2 days   Lenka Zhao D.  05/17/2015, 11:10 AM  Pager number [phone]

## 2015-05-18 NOTE — Discharge Summary (Signed)
Physician Discharge Summary  Stephen Riddle HYQ:[id number] DOB: 01-22-1976 DOA: 05/15/2015  PCP: No primary care provider on file.  Admit date: 05/15/2015 Discharge date: 05/17/2015  Time spent: 35 minutes  Recommendations for Outpatient Follow-up:  Needs repeat LFT.   Discharge Diagnoses:    Choledocholithiasis   Abdominal pain   Abnormal transaminases  Discharge Condition: stable.   Diet recommendation: bland diet  Filed Weights   05/15/15 1852  Weight: 67.677 kg (149 lb 3.2 oz)    History of present illness:  Stephen Riddle is a 39 y.o. male with PMH significant for cholecystectomy, who presents complaining of abdominal pain, epigastric and RUQ since couple of months ago. The abdominal pain is getting worse. It is aggravated by meals. He denies diarrhea, associated with nausea. He drinks alcohol on weekends.    Hospital Course:  1-Choledocholithiasis: RUQ, epigastric pain with transaminases: suspect obstructive cholestatic. RUQ [place] with dilated Common bile duct and intra hepatic bile ducts. A distal common bile duct is not excluded. Patient presents with elevated LFT and bilirubin.  IV antibiotics due to leukocytosis, received IV Unasyn. He will be discharge on Augmentin for 5 more days.  MRCP probably filling defect in the common bile duct GI has been consulted, Patient underwent ERCP with sphincterotomy/papillotomy and ERCP with removal of calculus/calculi IV fluids, IV protonix. IV pain medications.  He is feeling better, abdominal pain has resolved. He is tolerating bland diet. Plan to discharge today.   2-Transaminases: Viral hepatitis panel negative. related to choledocholithiasis. Trending down. Repeat labs outpatient   3-Screening for HIV negative  Procedures:  ERCP  Consultations:  GI, [place].   Discharge Exam: Filed Vitals:   05/17/15 0535  BP: 111/70  Pulse: 61  Temp: 97.9 F (36.6 C)  Resp: 16    General: NAD Cardiovascular: S 1, S 2  RRR Respiratory: CTA Abdomen; soft, nt nd  Discharge Instructions   Discharge Instructions    Diet - low sodium heart healthy    Complete by:  As directed      Increase activity slowly    Complete by:  As directed           Discharge Medication List as of 05/17/2015 11:42 AM    START taking these medications   Details  amoxicillin-clavulanate (AUGMENTIN) 875-125 MG tablet Take 1 tablet by mouth 2 (two) times daily., Starting 05/17/2015, Until Discontinued, Print    docusate sodium (COLACE) 100 MG capsule Take 1 capsule (100 mg total) by mouth 2 (two) times daily., Starting 05/17/2015, Until Discontinued, Print    feeding supplement (BOOST / RESOURCE BREEZE) LIQD Take 1 Container by mouth 3 (three) times daily between meals., Starting 05/17/2015, Until Discontinued, Print    oxyCODONE (OXY IR/ROXICODONE) 5 MG immediate release tablet Take 1 tablet (5 mg total) by mouth every 4 (four) hours as needed for moderate pain., Starting 05/17/2015, Until Discontinued, Print    pantoprazole (PROTONIX) 40 MG tablet Take 1 tablet (40 mg total) by mouth daily., Starting 05/17/2015, Until Discontinued, Print       No Known Allergies Follow-up Information    Follow up with Family medicine at [facility] . Go on 05/23/2015.   Why:  You have been scheduled an appointment to see Mitzie Na on 05/23/15 at 11:15 am    Contact information:   [address]  [place]  [phone]       Follow up with [facility].   Specialty:  Gastroenterology   Why:  liver  problem   Contact information:   [address] [place] [address] [phone]       The results of significant diagnostics from this hospitalization (including imaging, microbiology, ancillary and laboratory) are listed below for reference.    Significant Diagnostic Studies: Mr 3d Recon At Scanner  May 18, 2015  CLINICAL DATA:  Abdominal pain including the epigastric and right upper  quadrant regions, worsening, aggravated by meals. Prior cholecystectomy. Dilated biliary tree on ultrasound EXAM: MRI ABDOMEN WITHOUT AND WITH CONTRAST (INCLUDING MRCP) TECHNIQUE: Multiplanar multisequence MR imaging of the abdomen was performed both before and after the administration of intravenous contrast. Heavily T2-weighted images of the biliary and pancreatic ducts were obtained, and three-dimensional MRCP images were rendered by post processing. Delayed post-contrast images were not performed due to the patient becoming nauseated. CONTRAST:  14mL MULTIHANCE GADOBENATE DIMEGLUMINE 529 MG/ML IV SOLN COMPARISON:  05/15/2015 FINDINGS: Despite efforts by the technologist and patient, motion artifact is present on today's exam and could not be eliminated. This reduces exam sensitivity and specificity. Lower chest:  Unremarkable Hepatobiliary: Assessment is complicated by the presence of 7 or 8 large metal clips along the common hepatic duct and proximal common bile duct which introduce extensive artifact, in addition to the fairly severe motion artifact on the dedicated MRCP sequence. There is a 3 cm segment of the biliary tree which is rendered essentially uninterpretable ; this would probably require ERCP to be able to visualize better. That said, the intrahepatic and mild extrahepatic biliary dilatation seems to be chronic. Looking at a CT of the abdomen from 08/16/2003, which is also post cholecystectomy, there is a similar degree of intrahepatic biliary dilatation, in the common bile duct measures 8 mm on that study compared to 9 mm today (measured on image 25, series 12 PICC). I do not observe abnormal enhancement along the biliary system to suggest cholangitis. On some of the coronal images, including image 8 of series 13 and image 23 of series 15, there do appear to be filling defects within the CBD with low T2 signal periphery and higher T2 signal centrally, 1 measuring 1.1 cm distally on image 23 of  series 15 and another potentially measuring up to 3.0 by 0.8 cm on image 7 of series 13. Images 6-8 of series 13 seemed show these best. There is no associated abnormal enhancement. No abnormal focal enhancement in the liver. Pancreas: Unremarkable Spleen: Unremarkable Adrenals/Urinary Tract: Unremarkable Stomach/Bowel: Unremarkable Vascular/Lymphatic: Unremarkable Other: No supplemental non-categorized findings. Musculoskeletal: Unremarkable IMPRESSION: 1. Imaging complicated by large number of cholecystectomy clips and motion artifact. I do suspect some filling defects in the common bile duct including a 0.8 cm in diameter filling defect with low T2 signal rim and high T2 signal center, and a similar but more elongated structure in the common hepatic duct extending to the common bile duct. These have an unusual appearance for stones but presumably represent gallstones. Blood products or sludge in the biliary tree seem possible but less likely. Notably, the intrahepatic and extrahepatic biliary dilatation is not appreciably worsened from multiple prior exams including 08/16/2003. Accordingly I do not believe these filling defects are necessarily obstructive. ERCP may be helpful in further evaluation. Electronically Signed   By: Gaylyn Rong M.D.   On: May 18, 2015 08:39   Dg Ercp Biliary & Pancreatic Ducts  2015-05-18  CLINICAL DATA:  Common bile duct calculi. EXAM: ERCP TECHNIQUE: Multiple spot images obtained with the fluoroscopic device and submitted for interpretation post-procedure. FLUOROSCOPY TIME:  If  the device does not provide the exposure index: Fluoroscopy Time:  8 minutes and 58 seconds. Number of Acquired Images:  10 COMPARISON:  MRI 05/15/2015 FINDINGS: Cholangiogram demonstrates filling defects in the extrahepatic biliary ducts. Wire was advanced into the intrahepatic ducts. Balloon was used for stone removal. Poor opacification of the biliary system on final image. IMPRESSION:  Choledocholithiasis and stone retrieval. These images were submitted for radiologic interpretation only. Please see the procedural report for the amount of contrast and the fluoroscopy time utilized. Electronically Signed   By: Richarda Overlie M.D.   On: 05/16/2015 15:23   Mr Roe Coombs W/wo Cm/mrcp  05/16/2015  CLINICAL DATA:  Abdominal pain including the epigastric and right upper quadrant regions, worsening, aggravated by meals. Prior cholecystectomy. Dilated biliary tree on ultrasound EXAM: MRI ABDOMEN WITHOUT AND WITH CONTRAST (INCLUDING MRCP) TECHNIQUE: Multiplanar multisequence MR imaging of the abdomen was performed both before and after the administration of intravenous contrast. Heavily T2-weighted images of the biliary and pancreatic ducts were obtained, and three-dimensional MRCP images were rendered by post processing. Delayed post-contrast images were not performed due to the patient becoming nauseated. CONTRAST:  14mL MULTIHANCE GADOBENATE DIMEGLUMINE 529 MG/ML IV SOLN COMPARISON:  05/15/2015 FINDINGS: Despite efforts by the technologist and patient, motion artifact is present on today's exam and could not be eliminated. This reduces exam sensitivity and specificity. Lower chest:  Unremarkable Hepatobiliary: Assessment is complicated by the presence of 7 or 8 large metal clips along the common hepatic duct and proximal common bile duct which introduce extensive artifact, in addition to the fairly severe motion artifact on the dedicated MRCP sequence. There is a 3 cm segment of the biliary tree which is rendered essentially uninterpretable ; this would probably require ERCP to be able to visualize better. That said, the intrahepatic and mild extrahepatic biliary dilatation seems to be chronic. Looking at a CT of the abdomen from 08/16/2003, which is also post cholecystectomy, there is a similar degree of intrahepatic biliary dilatation, in the common bile duct measures 8 mm on that study compared to 9 mm today  (measured on image 25, series 12 PICC). I do not observe abnormal enhancement along the biliary system to suggest cholangitis. On some of the coronal images, including image 8 of series 13 and image 23 of series 15, there do appear to be filling defects within the CBD with low T2 signal periphery and higher T2 signal centrally, 1 measuring 1.1 cm distally on image 23 of series 15 and another potentially measuring up to 3.0 by 0.8 cm on image 7 of series 13. Images 6-8 of series 13 seemed show these best. There is no associated abnormal enhancement. No abnormal focal enhancement in the liver. Pancreas: Unremarkable Spleen: Unremarkable Adrenals/Urinary Tract: Unremarkable Stomach/Bowel: Unremarkable Vascular/Lymphatic: Unremarkable Other: No supplemental non-categorized findings. Musculoskeletal: Unremarkable IMPRESSION: 1. Imaging complicated by large number of cholecystectomy clips and motion artifact. I do suspect some filling defects in the common bile duct including a 0.8 cm in diameter filling defect with low T2 signal rim and high T2 signal center, and a similar but more elongated structure in the common hepatic duct extending to the common bile duct. These have an unusual appearance for stones but presumably represent gallstones. Blood products or sludge in the biliary tree seem possible but less likely. Notably, the intrahepatic and extrahepatic biliary dilatation is not appreciably worsened from multiple prior exams including 08/16/2003. Accordingly I do not believe these filling defects are necessarily obstructive. ERCP may be helpful in  further evaluation. Electronically Signed   By: Gaylyn RongWalter  Liebkemann M.D.   On: 05/16/2015 08:39   [place] Abdomen Limited Ruq  05/15/2015  CLINICAL DATA:  Elevated liver function tests, abdominal pain for 6 months, prior cholecystectomy EXAM: US ABDOMEN LIMITED - RIGHT UPPER QUADRANT COMPARISON:  CT abdomen pelvis of 08/19/2003 FINDINGS: Gallbladder: The gallbladder has  previously been resected. Common bile duct: Diameter: The common bile duct is dilated measuring 15 mm in diameter, possibly normal post cholecystectomy. If further assessment is warranted CT of the abdomen with IV contrast media is recommended versus MR with MRCP. Liver: The liver has a normal echogenic pattern. Intrahepatic ducts are noted to be prominent, and as noted above CT or MRI is recommended to assess for a possible distal common bile duct abnormality. And echogenic focus in the left lobe may represent calcified granuloma. IMPRESSION: Dilated common bile duct and intrahepatic bile ducts. Although this may be normal post cholecystectomy, a distal common bile duct lesion cannot be excluded. Consider MR of the abdomen with MRCP versus CT of the abdomen with IV contrast media. Electronically Signed   By: Dwyane DeePaul  Barry M.D.   On: 05/15/2015 16:46    Microbiology: No results found for this or any previous visit (from the past 240 hour(s)).   Labs: Basic Metabolic Panel:  Recent Labs Lab 05/15/15 1156 05/16/15 0813 05/17/15 0545  NA 139 136 136  K 4.9 3.8 3.7  CL 105 108 107  CO2 26 22 23   GLUCOSE 91 94 95  BUN 8 7 6   CREATININE 0.83 0.69 0.72  CALCIUM 9.4 8.3* 8.5*   Liver Function Tests:  Recent Labs Lab 05/15/15 1156 05/16/15 0813 05/17/15 0545  AST 433* 190* 72*  ALT 293* 261* 172*  ALKPHOS 239* 253* 208*  BILITOT 3.2* 1.9* 1.2  PROT 8.0 6.4* 6.1*  ALBUMIN 4.2 3.4* 3.2*    Recent Labs Lab 05/15/15 1156  LIPASE 27   No results for input(s): AMMONIA in the last 168 hours. CBC:  Recent Labs Lab 05/15/15 1156 05/16/15 0813  WBC 12.9* 7.7  HGB 16.6 14.3  HCT 48.8 41.9  MCV 87.9 87.3  PLT 225 177   Cardiac Enzymes: No results for input(s): CKTOTAL, CKMB, CKMBINDEX, TROPONINI in the last 168 hours. BNP: BNP (last 3 results) No results for input(s): BNP in the last 8760 hours.  ProBNP (last 3 results) No results for input(s): PROBNP in the last 8760  hours.  CBG: No results for input(s): GLUCAP in the last 168 hours.     Signed:  Hartley BarefootRegalado, Belkys A  [facility] 05/18/2015, 7:17 PM

## 2015-05-21 ENCOUNTER — Encounter (HOSPITAL_COMMUNITY): Payer: Self-pay | Admitting: Gastroenterology

## 2015-06-24 ENCOUNTER — Emergency Department (HOSPITAL_COMMUNITY): Payer: Self-pay

## 2015-06-24 ENCOUNTER — Encounter (HOSPITAL_COMMUNITY): Payer: Self-pay | Admitting: Emergency Medicine

## 2015-06-24 ENCOUNTER — Emergency Department (HOSPITAL_COMMUNITY)
Admission: EM | Admit: 2015-06-24 | Discharge: 2015-06-24 | Disposition: A | Discharge status: Home / Self Care | Payer: Self-pay | Attending: Emergency Medicine | Admitting: Emergency Medicine

## 2015-06-24 DIAGNOSIS — R197 Diarrhea, unspecified: Secondary | ICD-10-CM | POA: Insufficient documentation

## 2015-06-24 DIAGNOSIS — Z9049 Acquired absence of other specified parts of digestive tract: Secondary | ICD-10-CM | POA: Insufficient documentation

## 2015-06-24 DIAGNOSIS — R112 Nausea with vomiting, unspecified: Secondary | ICD-10-CM | POA: Insufficient documentation

## 2015-06-24 DIAGNOSIS — R109 Unspecified abdominal pain: Secondary | ICD-10-CM | POA: Insufficient documentation

## 2015-06-24 DIAGNOSIS — F17200 Nicotine dependence, unspecified, uncomplicated: Secondary | ICD-10-CM | POA: Insufficient documentation

## 2015-06-24 LAB — URINALYSIS, ROUTINE W REFLEX MICROSCOPIC
Glucose, UA: NEGATIVE mg/dL
Hgb urine dipstick: NEGATIVE
KETONES UR: 15 mg/dL — AB
Leukocytes, UA: NEGATIVE
NITRITE: NEGATIVE
PH: 5 (ref 5.0–8.0)
PROTEIN: 30 mg/dL — AB
Specific Gravity, Urine: 1.027 (ref 1.005–1.030)

## 2015-06-24 LAB — CBC
HCT: 53.9 % — ABNORMAL HIGH (ref 39.0–52.0)
Hemoglobin: 17.9 g/dL — ABNORMAL HIGH (ref 13.0–17.0)
MCH: 29.5 pg (ref 26.0–34.0)
MCHC: 33.2 g/dL (ref 30.0–36.0)
MCV: 88.8 fL (ref 78.0–100.0)
PLATELETS: 191 10*3/uL (ref 150–400)
RBC: 6.07 MIL/uL — ABNORMAL HIGH (ref 4.22–5.81)
RDW: 14 % (ref 11.5–15.5)
WBC: 13.2 10*3/uL — AB (ref 4.0–10.5)

## 2015-06-24 LAB — COMPREHENSIVE METABOLIC PANEL
ALT: 26 U/L (ref 17–63)
AST: 27 U/L (ref 15–41)
Albumin: 4.5 g/dL (ref 3.5–5.0)
Alkaline Phosphatase: 121 U/L (ref 38–126)
Anion gap: 13 (ref 5–15)
BILIRUBIN TOTAL: 0.5 mg/dL (ref 0.3–1.2)
BUN: 7 mg/dL (ref 6–20)
CALCIUM: 9.4 mg/dL (ref 8.9–10.3)
CO2: 22 mmol/L (ref 22–32)
CREATININE: 1.07 mg/dL (ref 0.61–1.24)
Chloride: 105 mmol/L (ref 101–111)
Glucose, Bld: 124 mg/dL — ABNORMAL HIGH (ref 65–99)
Potassium: 3.4 mmol/L — ABNORMAL LOW (ref 3.5–5.1)
Sodium: 140 mmol/L (ref 135–145)
TOTAL PROTEIN: 8.3 g/dL — AB (ref 6.5–8.1)

## 2015-06-24 LAB — URINE MICROSCOPIC-ADD ON

## 2015-06-24 LAB — LIPASE, BLOOD: LIPASE: 20 U/L (ref 11–51)

## 2015-06-24 MED ORDER — ONDANSETRON HCL 4 MG/2ML IJ SOLN
4.0000 mg | Freq: Once | INTRAMUSCULAR | Status: AC
  Administered 2015-06-24: 4 mg via INTRAVENOUS
  Filled 2015-06-24: qty 2

## 2015-06-24 MED ORDER — HYDROMORPHONE HCL 1 MG/ML IJ SOLN
1.0000 mg | Freq: Once | INTRAMUSCULAR | Status: AC
  Administered 2015-06-24: 1 mg via INTRAVENOUS
  Filled 2015-06-24: qty 1

## 2015-06-24 MED ORDER — FENTANYL CITRATE (PF) 100 MCG/2ML IJ SOLN
50.0000 ug | Freq: Once | INTRAMUSCULAR | Status: AC
  Administered 2015-06-24: 50 ug via INTRAVENOUS
  Filled 2015-06-24: qty 2

## 2015-06-24 MED ORDER — DIPHENOXYLATE-ATROPINE 2.5-0.025 MG PO TABS: 2.0000 | ORAL_TABLET | Freq: Four times a day (QID) | ORAL | Status: DC | PRN

## 2015-06-24 MED ORDER — SODIUM CHLORIDE 0.9 % IV BOLUS (SEPSIS)
1000.0000 mL | Freq: Once | INTRAVENOUS | Status: AC
  Administered 2015-06-24: 1000 mL via INTRAVENOUS

## 2015-06-24 MED ORDER — HYDROCODONE-ACETAMINOPHEN 5-325 MG PO TABS: 2.0000 | ORAL_TABLET | ORAL | Status: DC | PRN

## 2015-06-24 NOTE — Discharge Instructions (Signed)
Abdominal Pain, Adult °Many things can cause abdominal pain. Usually, abdominal pain is not caused by a disease and will improve without treatment. It can often be observed and treated at home. Your health care provider will do a physical exam and possibly order blood tests and X-rays to help determine the seriousness of your pain. However, in many cases, more time must pass before a clear cause of the pain can be found. Before that point, your health care provider may not know if you need more testing or further treatment. °HOME CARE INSTRUCTIONS °Monitor your abdominal pain for any changes. The following actions may help to alleviate any discomfort you are experiencing: °· Only take over-the-counter or prescription medicines as directed by your health care provider. °· Do not take laxatives unless directed to do so by your health care provider. °· Try a clear liquid diet (broth, tea, or water) as directed by your health care provider. Slowly move to a bland diet as tolerated. °SEEK MEDICAL CARE IF: °· You have unexplained abdominal pain. °· You have abdominal pain associated with nausea or diarrhea. °· You have pain when you urinate or have a bowel movement. °· You experience abdominal pain that wakes you in the night. °· You have abdominal pain that is worsened or improved by eating food. °· You have abdominal pain that is worsened with eating fatty foods. °· You have a fever. °SEEK IMMEDIATE MEDICAL CARE IF: °· Your pain does not go away within 2 hours. °· You keep throwing up (vomiting). °· Your pain is felt only in portions of the abdomen, such as the right side or the left lower portion of the abdomen. °· You pass bloody or black tarry stools. °MAKE SURE YOU: °· Understand these instructions. °· Will watch your condition. °· Will get help right away if you are not doing well or get worse. °  °This information is not intended to replace advice given to you by your health care provider. Make sure you discuss  any questions you have with your health care provider. °  °Document Released: 04/28/2005 Document Revised: 04/09/2015 Document Reviewed: 03/28/2013 °Elsevier Interactive Patient Education ©2016 [employer] ° °Diarrhea °Diarrhea is frequent loose and watery bowel movements. It can cause you to feel weak and dehydrated. Dehydration can cause you to become tired and thirsty, have a dry mouth, and have decreased urination that often is dark yellow. Diarrhea is a sign of another problem, most often an infection that will not last long. In most cases, diarrhea typically lasts 2-3 days. However, it can last longer if it is a sign of something more serious. It is important to treat your diarrhea as directed by your caregiver to lessen or prevent future episodes of diarrhea. °CAUSES  °Some common causes include: °· Gastrointestinal infections caused by viruses, bacteria, or parasites. °· Food poisoning or food allergies. °· Certain medicines, such as antibiotics, chemotherapy, and laxatives. °· Artificial sweeteners and fructose. °· Digestive disorders. °HOME CARE INSTRUCTIONS °· Ensure adequate fluid intake (hydration): Have 1 cup (8 oz) of fluid for each diarrhea episode. Avoid fluids that contain simple sugars or sports drinks, fruit juices, whole milk products, and sodas. Your urine should be clear or pale yellow if you are drinking enough fluids. Hydrate with an oral rehydration solution that you can purchase at pharmacies, retail stores, and online. You can prepare an oral rehydration solution at home by mixing the following ingredients together: °¨  - tsp table salt. °¨ ¾ tsp baking soda. °¨    tsp salt substitute containing potassium chloride. °¨ 1  tablespoons sugar. °¨ 1 L (34 oz) of water. °· Certain foods and beverages may increase the speed at which food moves through the gastrointestinal (GI) tract. These foods and beverages should be avoided and include: °¨ Caffeinated and alcoholic beverages. °¨ High-fiber  foods, such as raw fruits and vegetables, nuts, seeds, and whole grain breads and cereals. °¨ Foods and beverages sweetened with sugar alcohols, such as xylitol, sorbitol, and mannitol. °· Some foods may be well tolerated and may help thicken stool including: °¨ Starchy foods, such as rice, toast, pasta, low-sugar cereal, oatmeal, grits, baked potatoes, crackers, and bagels. °¨ Bananas. °¨ Applesauce. °· Add probiotic-rich foods to help increase healthy bacteria in the GI tract, such as yogurt and fermented milk products. °· Wash your hands well after each diarrhea episode. °· Only take over-the-counter or prescription medicines as directed by your caregiver. °· Take a warm bath to relieve any burning or pain from frequent diarrhea episodes. °SEEK IMMEDIATE MEDICAL CARE IF:  °· You are unable to keep fluids down. °· You have persistent vomiting. °· You have blood in your stool, or your stools are black and tarry. °· You do not urinate in 6-8 hours, or there is only a small amount of very dark urine. °· You have abdominal pain that increases or localizes. °· You have weakness, dizziness, confusion, or light-headedness. °· You have a severe headache. °· Your diarrhea gets worse or does not get better. °· You have a fever or persistent symptoms for more than 2-3 days. °· You have a fever and your symptoms suddenly get worse. °MAKE SURE YOU:  °· Understand these instructions. °· Will watch your condition. °· Will get help right away if you are not doing well or get worse. °  °This information is not intended to replace advice given to you by your health care provider. Make sure you discuss any questions you have with your health care provider. °  °Document Released: 07/09/2002 Document Revised: 08/09/2014 Document Reviewed: 03/26/2012 °Elsevier Interactive Patient Education ©2016 [employer] ° °

## 2015-06-24 NOTE — ED Notes (Signed)
RN asked patient to provide urine sample; pt states ",my chest hurts when I pee"; MD notified

## 2015-06-24 NOTE — ED Provider Notes (Signed)
CSN: [id number]   Arrival date & time 06/24/15 0121  History  By signing my name below, I, Bethel Born, attest that this documentation has been prepared under the direction and in the presence of Gilda Crease, MD. Electronically Signed: Bethel Born, ED Scribe. 06/24/2015. 5:17 AM.  Chief Complaint  Patient presents with  . Flank Pain    HPI The history is provided by the patient. No language interpreter was used.   Stephen Riddle is a 39 y.o. male who presents to the Emergency Department complaining of severe and constant right lateral abdominal pain with onset 1 hour ago. The pain is exacerbated by breathing and moving, improved with pressure. Associated symptoms include nausea and emesis.   History reviewed. No pertinent past medical history.  Past Surgical History  Procedure Laterality Date  . Cholecystectomy    . Ercp N/A 05/16/2015    Procedure: ENDOSCOPIC RETROGRADE CHOLANGIOPANCREATOGRAPHY (ERCP);  Surgeon: Rachael Fee, MD;  Location: [facility] ENDOSCOPY;  Service: Endoscopy;  Laterality: N/A;    No family history on file.  Social History  Substance Use Topics  . Smoking status: Current Every Day Smoker -- 0.50 packs/day  . Smokeless tobacco: None  . Alcohol Use: Yes     Review of Systems  Gastrointestinal: Positive for nausea, vomiting and abdominal pain.  All other systems reviewed and are negative.  Home Medications   Prior to Admission medications   Medication Sig Start Date End Date Taking? Authorizing Provider  amoxicillin-clavulanate (AUGMENTIN) 875-125 MG tablet Take 1 tablet by mouth 2 (two) times daily. Patient not taking: Reported on 06/24/2015 05/17/15   Belkys A Regalado, MD  docusate sodium (COLACE) 100 MG capsule Take 1 capsule (100 mg total) by mouth 2 (two) times daily. Patient not taking: Reported on 06/24/2015 05/17/15   Belkys A Regalado, MD  feeding supplement (BOOST / RESOURCE BREEZE) LIQD Take 1 Container by mouth 3 (three)  times daily between meals. Patient not taking: Reported on 06/24/2015 05/17/15   Belkys A Regalado, MD  oxyCODONE (OXY IR/ROXICODONE) 5 MG immediate release tablet Take 1 tablet (5 mg total) by mouth every 4 (four) hours as needed for moderate pain. Patient not taking: Reported on 06/24/2015 05/17/15   Belkys A Regalado, MD  pantoprazole (PROTONIX) 40 MG tablet Take 1 tablet (40 mg total) by mouth daily. Patient not taking: Reported on 06/24/2015 05/17/15   Belkys A Regalado, MD    Allergies  Review of patient's allergies indicates no known allergies.  Triage Vitals: BP 129/102 mmHg  Pulse 86  Temp(Src) 97.6 F (36.4 C) (Oral)  Resp 18  SpO2 100%  Physical Exam  Constitutional: He is oriented to person, place, and time. He appears well-developed and well-nourished. No distress.  HENT:  Head: Normocephalic and atraumatic.  Right Ear: Hearing normal.  Left Ear: Hearing normal.  Nose: Nose normal.  Mouth/Throat: Oropharynx is clear and moist and mucous membranes are normal.  Eyes: Conjunctivae and EOM are normal. Pupils are equal, round, and reactive to light.  Neck: Normal range of motion. Neck supple.  Cardiovascular: Regular rhythm, S1 normal and S2 normal.  Exam reveals no gallop and no friction rub.   No murmur heard. Pulmonary/Chest: Effort normal and breath sounds normal. No respiratory distress. He exhibits no tenderness.  Abdominal: Soft. Normal appearance and bowel sounds are normal. There is no hepatosplenomegaly. There is tenderness. There is no rebound, no guarding, no tenderness at McBurney's point and negative Murphy's sign. No hernia.  Tenderness at the  right lateral abdomen and flank  Musculoskeletal: Normal range of motion.  Neurological: He is alert and oriented to person, place, and time. He has normal strength. No cranial nerve deficit or sensory deficit. Coordination normal. GCS eye subscore is 4. GCS verbal subscore is 5. GCS motor subscore is 6.  Skin: Skin is  warm, dry and intact. No rash noted. No cyanosis.  Psychiatric: He has a normal mood and affect. His speech is normal and behavior is normal. Thought content normal.  Nursing note and vitals reviewed.   ED Course  Procedures   DIAGNOSTIC STUDIES: Oxygen Saturation is 100% on RA, normal by my interpretation.    COORDINATION OF CARE: 1:47 AM Discussed treatment plan which includes lab work and pain management with pt at bedside and pt agreed to plan.  Labs Reviewed  COMPREHENSIVE METABOLIC PANEL - Abnormal; Notable for the following:    Potassium 3.4 (*)    Glucose, Bld 124 (*)    Total Protein 8.3 (*)    All other components within normal limits  CBC - Abnormal; Notable for the following:    WBC 13.2 (*)    RBC 6.07 (*)    Hemoglobin 17.9 (*)    HCT 53.9 (*)    All other components within normal limits  URINALYSIS, ROUTINE W REFLEX MICROSCOPIC (NOT [facility]) - Abnormal; Notable for the following:    Color, Urine AMBER (*)    APPearance CLOUDY (*)    Bilirubin Urine SMALL (*)    Ketones, ur 15 (*)    Protein, ur 30 (*)    All other components within normal limits  URINE MICROSCOPIC-ADD ON - Abnormal; Notable for the following:    Squamous Epithelial / LPF 0-5 (*)    Bacteria, UA RARE (*)    Casts HYALINE CASTS (*)    All other components within normal limits  LIPASE, BLOOD    Imaging Review Dg Abd Acute W/chest  06/24/2015  CLINICAL DATA:  Acute onset of right lateral abdominal pain and generalized chest pain. Vomiting. Initial encounter. EXAM: DG ABDOMEN ACUTE W/ 1V CHEST COMPARISON:  CT of the abdomen and pelvis from 08/19/2003 FINDINGS: The lungs are well-aerated. Mild bibasilar opacities likely reflect atelectasis. There is no evidence of pleural effusion or pneumothorax. The cardiomediastinal silhouette is within normal limits. The visualized bowel gas pattern is unremarkable. The stomach is partially filled with fluid and air. Scattered fluid and air are seen within the  colon; there is no evidence of small bowel dilatation to suggest obstruction. No free intra-abdominal air is identified on the provided upright view. Clips are noted within the right upper quadrant, reflecting prior cholecystectomy. Clips are also noted at the left hemipelvis. No acute osseous abnormalities are seen; the sacroiliac joints are unremarkable in appearance. IMPRESSION: 1. Unremarkable bowel gas pattern; no free intra-abdominal air seen. 2. Mild bibasilar airspace opacities likely reflect atelectasis. Lungs otherwise clear. Electronically Signed   By: Roanna RaiderJeffery  Chang M.D.   On: 06/24/2015 03:39    I personally reviewed and evaluated these lab results as a part of my medical decision-making.    MDM   Final diagnoses:  Flank pain, acute   flank pain  Presents to the emergency department for evaluation of abdominal and flank pain. Patient complaining of pain on the right side of his abdomen. He indicates the lateral, mid axillary line region of his abdomen on the right side. The area is tender to touch but also worsens when he bends and twists.  Patient does have a previous history of choledocholithiasis. With that diagnosis, he had right upper quadrant pain and LFT abnormality. Patient does not have tenderness in the right upper quadrant today and his LFTs are normal. I do not feel that this represents recurrent choledocholithiasis at this time. Abdominal series x-ray reveals normal bowel gas, in the lungs. Urinalysis normal, no sign of infection. All lab work was normal. Pain etiology unclear at this time, but it does not appear that the patient has an acute process that requires any further intervention at this time. He was treated with fluids and analgesia here in the ER. He'll be discharged, return as needed.  I personally performed the services described in this documentation, which was scribed in my presence. The recorded information has been reviewed and is accurate.    Gilda Crease, MD 06/24/15 [phone]

## 2015-06-24 NOTE — ED Notes (Signed)
Pt. reports right flank pain with emesis onset this evening , no dysuria or hematuria .

## 2015-06-24 NOTE — ED Notes (Signed)
Patient transported to X-ray 

## 2015-06-24 NOTE — ED Notes (Signed)
Pt encouraged again to provide urine sample; urinal at bedside; pt verbalizes understanding

## 2017-06-12 IMAGING — CR DG ABDOMEN ACUTE W/ 1V CHEST
3 series · 3 of 3 positions shown · non-contrast
Comparison: CT of the abdomen and pelvis from 08/19/2003

CLINICAL DATA: Acute onset of right lateral abdominal pain and
generalized chest pain. Vomiting. Initial encounter.

EXAM:
DG ABDOMEN ACUTE W/ 1V CHEST

[chest pa]
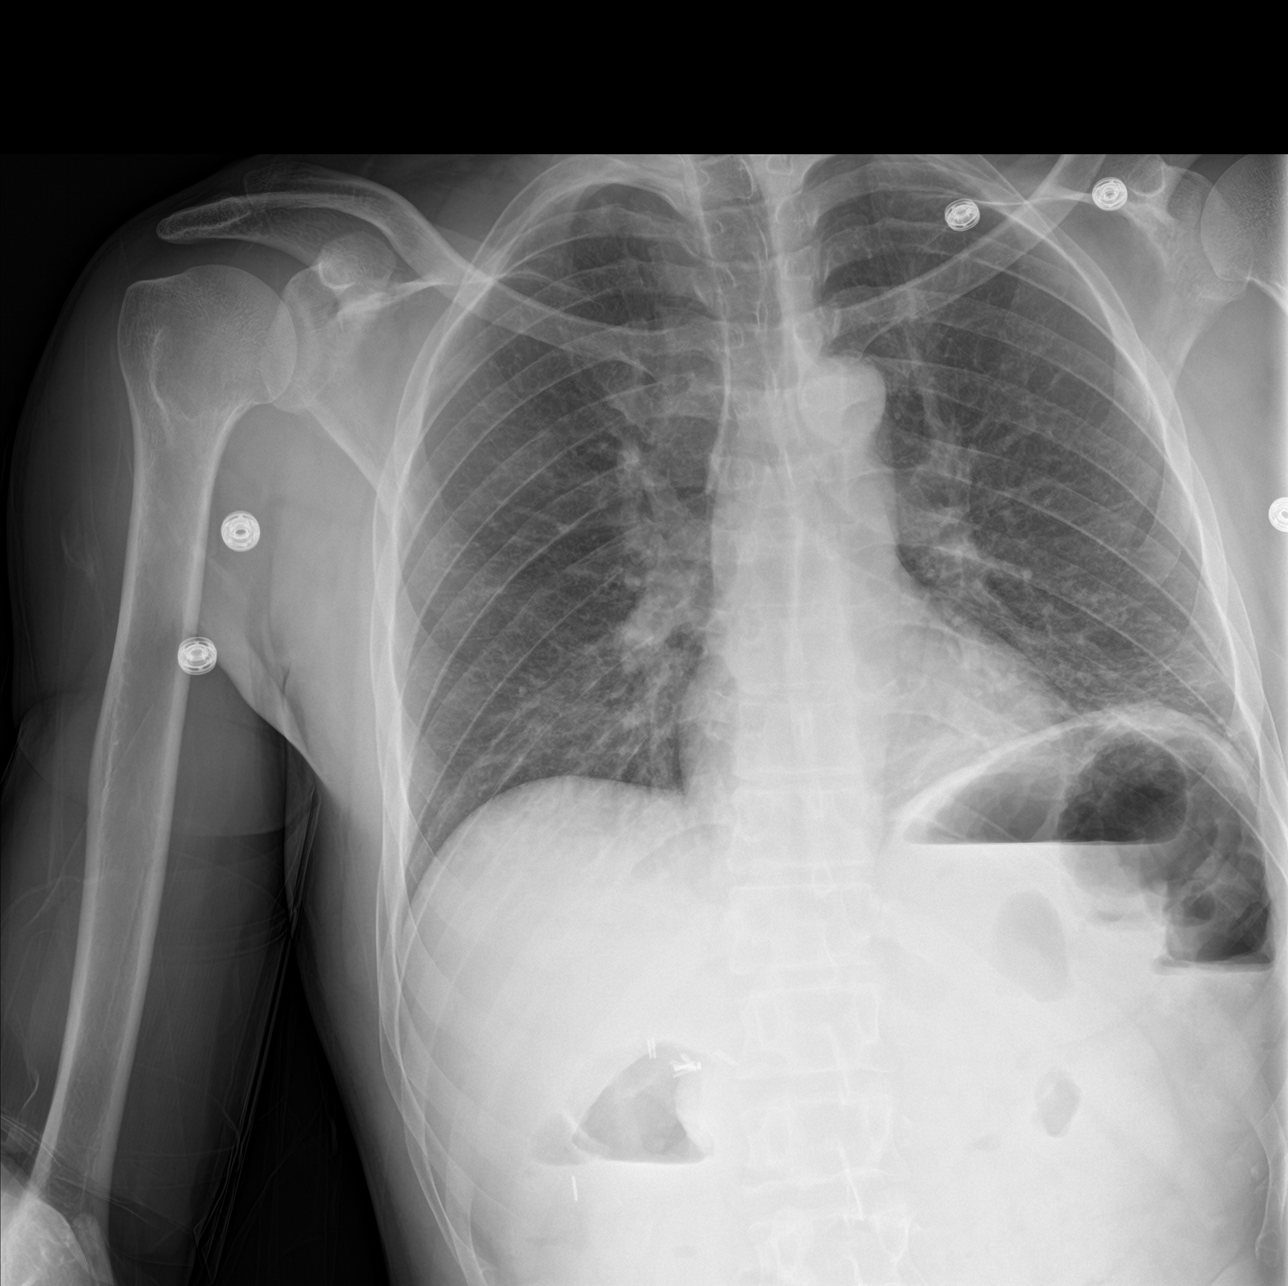

[abdomen erect]
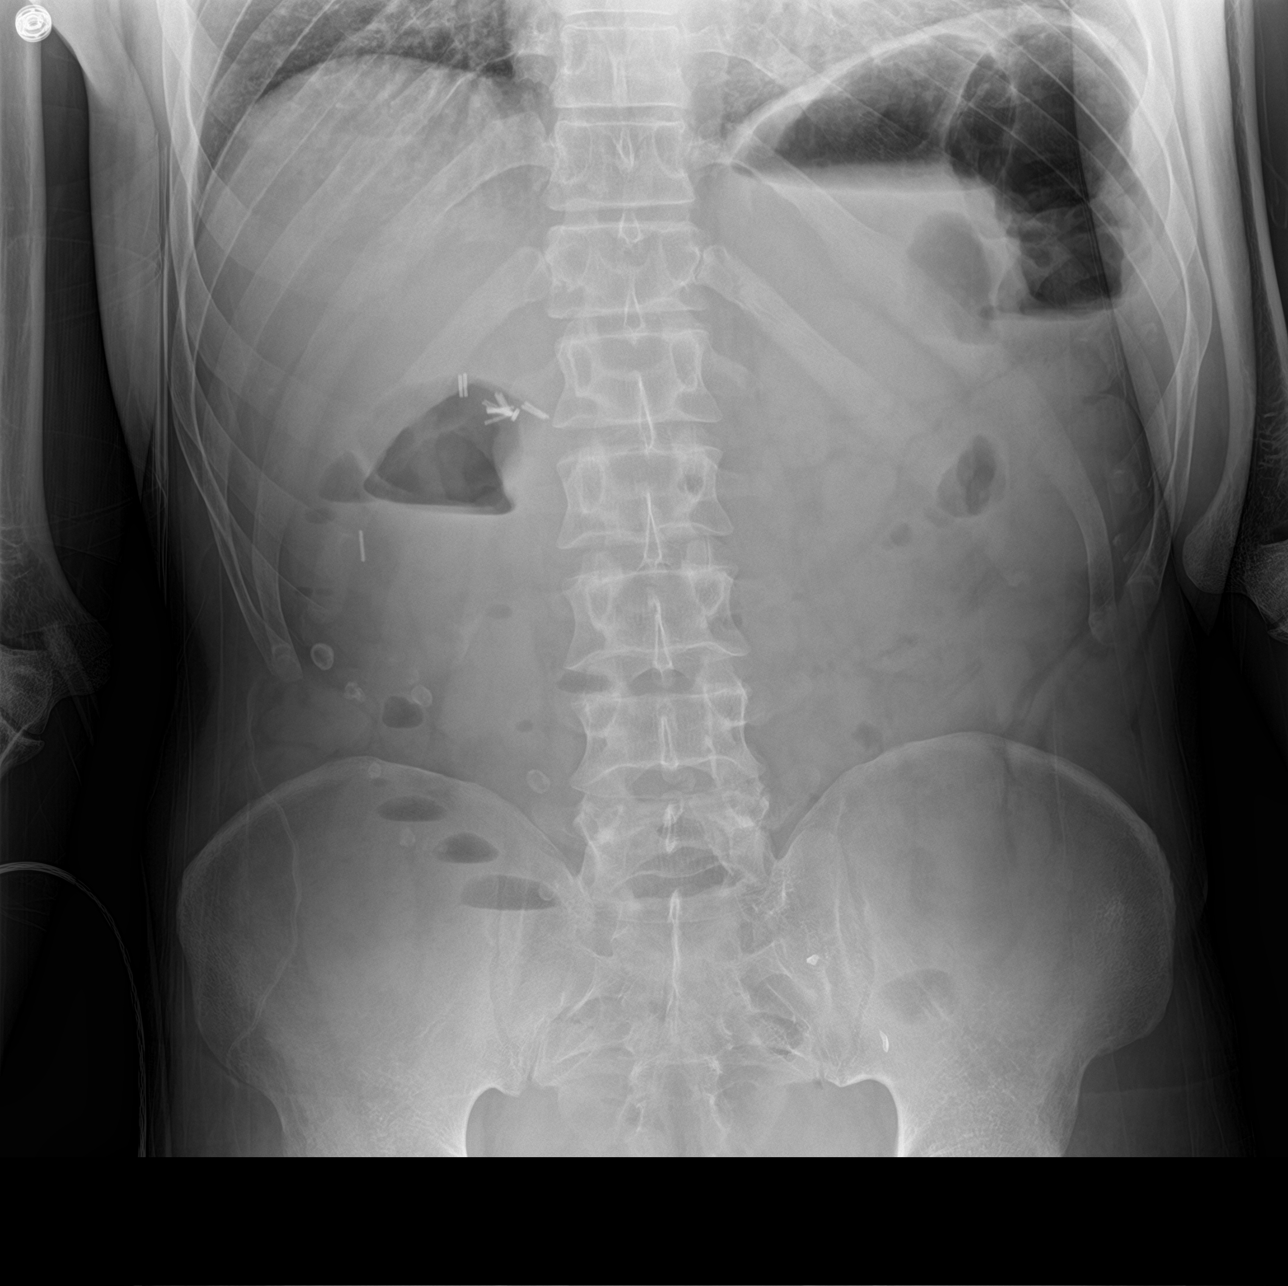

[abdomen supine]
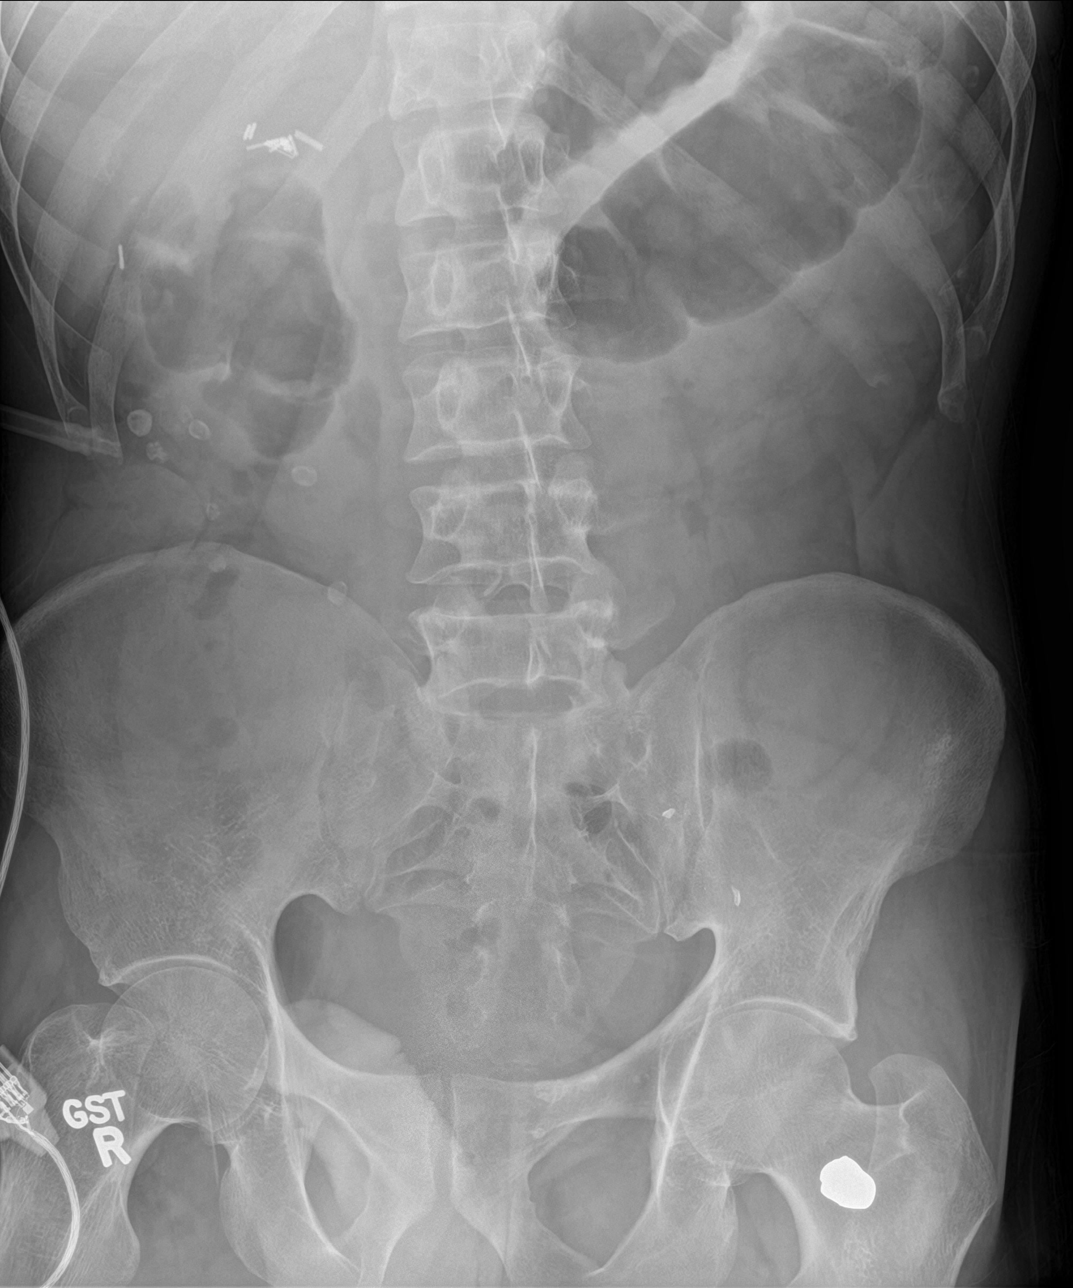

[3 of 3 positions shown; findings below may reference images not displayed]

FINDINGS: The lungs are well-aerated. Mild bibasilar opacities likely reflect
atelectasis. There is no evidence of pleural effusion or
pneumothorax. The cardiomediastinal silhouette is within normal
limits.

The visualized bowel gas pattern is unremarkable. The stomach is
partially filled with fluid and air. Scattered fluid and air are
seen within the colon; there is no evidence of small bowel
dilatation to suggest obstruction. No free intra-abdominal air is
identified on the provided upright view. Clips are noted within the
right upper quadrant, reflecting prior cholecystectomy. Clips are
also noted at the left hemipelvis.

No acute osseous abnormalities are seen; the sacroiliac joints are
unremarkable in appearance.
IMPRESSION: 1. Unremarkable bowel gas pattern; no free intra-abdominal air seen.
2. Mild bibasilar airspace opacities likely reflect atelectasis.
Lungs otherwise clear.

## 2018-08-03 ENCOUNTER — Encounter (HOSPITAL_COMMUNITY): Payer: Self-pay

## 2018-08-03 ENCOUNTER — Ambulatory Visit (HOSPITAL_COMMUNITY)
Admission: EM | Admit: 2018-08-03 | Discharge: 2018-08-03 | Disposition: A | Discharge status: Home / Self Care | Payer: Self-pay | Attending: Family Medicine | Admitting: Family Medicine

## 2018-08-03 DIAGNOSIS — S161XXA Strain of muscle, fascia and tendon at neck level, initial encounter: Secondary | ICD-10-CM

## 2018-08-03 DIAGNOSIS — M7918 Myalgia, other site: Secondary | ICD-10-CM

## 2018-08-03 DIAGNOSIS — M545 Low back pain, unspecified: Secondary | ICD-10-CM

## 2018-08-03 DIAGNOSIS — R51 Headache: Secondary | ICD-10-CM

## 2018-08-03 MED ORDER — TIZANIDINE HCL 4 MG PO TABS: 4.0000 mg | ORAL_TABLET | Freq: Four times a day (QID) | ORAL | 0 refills | Status: AC | PRN

## 2018-08-03 MED ORDER — IBUPROFEN 800 MG PO TABS: 800.0000 mg | ORAL_TABLET | Freq: Three times a day (TID) | ORAL | 0 refills | Status: AC
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
## 2018-08-03 NOTE — Discharge Instructions (Signed)
Rest Use ice or heat to neck and back Stay active within your pain limits.  Do not do any heavy lifting. Take ibuprofen 3 times a day with food.  This is for pain and inflammation Take tizanidine as needed as a muscle relaxer.  This is useful at night Expect improvement over the next several days. Obtain follow-up if you do not improve as expected
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
## 2022-05-22 ENCOUNTER — Inpatient Hospital Stay
Admit: 2022-05-22 | Discharge: 2022-05-22 | Payer: BLUE CROSS/BLUE SHIELD | Attending: Student in an Organized Health Care Education/Training Program

## 2022-05-22 ENCOUNTER — Emergency Department: Admit: 2022-05-22 | Payer: BLUE CROSS/BLUE SHIELD | Primary: Family Medicine

## 2022-05-22 DIAGNOSIS — U071 COVID-19: Secondary | ICD-10-CM

## 2022-05-22 DIAGNOSIS — R519 Headache, unspecified: Secondary | ICD-10-CM

## 2022-05-22 DIAGNOSIS — R509 Fever, unspecified: Secondary | ICD-10-CM

## 2022-05-22 DIAGNOSIS — J1282 Pneumonia due to coronavirus disease 2019: Secondary | ICD-10-CM

## 2022-05-22 LAB — CBC WITH AUTO DIFFERENTIAL
BKR WAM ABSOLUTE IMMATURE GRANULOCYTES.: 0.07 x 1000/ÂµL (ref 0.00–0.30)
BKR WAM ABSOLUTE LYMPHOCYTE COUNT.: 1.09 x 1000/ÂµL (ref 0.60–3.70)
BKR WAM ABSOLUTE NRBC (2 DEC): 0 x 1000/ÂµL (ref 0.00–1.00)
BKR WAM ANALYZER ANC: 9.52 x 1000/ÂµL — ABNORMAL HIGH (ref 2.00–7.60)
BKR WAM BASOPHIL ABSOLUTE COUNT.: 0.05 x 1000/ÂµL (ref 0.00–1.00)
BKR WAM BASOPHILS: 0.4 % (ref 0.0–1.4)
BKR WAM EOSINOPHIL ABSOLUTE COUNT.: 0.02 x 1000/ÂµL (ref 0.00–1.00)
BKR WAM EOSINOPHILS: 0.2 % (ref 0.0–5.0)
BKR WAM HEMATOCRIT (2 DEC): 44 % (ref 38.50–50.00)
BKR WAM HEMOGLOBIN: 14.8 g/dL (ref 13.2–17.1)
BKR WAM IMMATURE GRANULOCYTES: 0.6 % (ref 0.0–1.0)
BKR WAM LYMPHOCYTES: 8.6 % — ABNORMAL LOW (ref 17.0–50.0)
BKR WAM MCH (PG): 28.6 pg (ref 27.0–33.0)
BKR WAM MCHC: 33.6 g/dL (ref 31.0–36.0)
BKR WAM MCV: 85.1 fL (ref 80.0–100.0)
BKR WAM MONOCYTE ABSOLUTE COUNT.: 1.86 x 1000/ÂµL — ABNORMAL HIGH (ref 0.00–1.00)
BKR WAM MONOCYTES: 14.8 % — ABNORMAL HIGH (ref 4.0–12.0)
BKR WAM MPV: 9.6 fL (ref 8.0–12.0)
BKR WAM NEUTROPHILS: 75.4 % — ABNORMAL HIGH (ref 39.0–72.0)
BKR WAM NUCLEATED RED BLOOD CELLS: 0 % (ref 0.0–1.0)
BKR WAM PLATELETS: 211 x1000/ÂµL (ref 150–420)
BKR WAM RDW-CV: 13.6 % (ref 11.0–15.0)
BKR WAM RED BLOOD CELL COUNT.: 5.17 M/ÂµL (ref 4.00–6.00)
BKR WAM WHITE BLOOD CELL COUNT: 12.6 x1000/ÂµL — ABNORMAL HIGH (ref 4.0–11.0)

## 2022-05-22 LAB — BASIC METABOLIC PANEL
BKR ANION GAP: 11 (ref 7–17)
BKR BLOOD UREA NITROGEN: 7 mg/dL (ref 6–20)
BKR BUN / CREAT RATIO: 8.8 (ref 8.0–23.0)
BKR CALCIUM: 9.2 mg/dL (ref 8.8–10.2)
BKR CHLORIDE: 103 mmol/L (ref 98–107)
BKR CO2: 19 mmol/L — ABNORMAL LOW (ref 20–30)
BKR CREATININE: 0.8 mg/dL (ref 0.40–1.30)
BKR EGFR, CREATININE (CKD-EPI 2021): >60 mL/min/{1.73_m2} (ref >=60)
BKR GLUCOSE: 105 mg/dL — ABNORMAL HIGH (ref 70–100)
BKR POTASSIUM: 3.6 mmol/L (ref 3.3–5.3)
BKR SODIUM: 133 mmol/L — ABNORMAL LOW (ref 136–144)

## 2022-05-22 LAB — INFLUENZA A+B/RSV BY RT-PCR
BKR INFLUENZA A: NEGATIVE
BKR INFLUENZA B: NEGATIVE
BKR RESPIRATORY SYNCYTIAL VIRUS: NEGATIVE

## 2022-05-22 LAB — SARS COV-2 (COVID-19) RNA: BKR SARS-COV-2 RNA (COVID-19) (YH): POSITIVE — AB

## 2022-05-22 MED ORDER — KETOROLAC 15 MG/ML INJECTION SOLUTION
15 mg/mL | Freq: Once | INTRAVENOUS | Status: CP
Start: 2022-05-22 — End: ?
  Administered 2022-05-22: 13:00:00 15 mL via INTRAVENOUS

## 2022-05-22 MED ORDER — SODIUM CHLORIDE 0.9 % BOLUS (NEW BAG)
0.9 % | Freq: Once | INTRAVENOUS | Status: CP
Start: 2022-05-22 — End: ?
  Administered 2022-05-22: 12:00:00 0.9 mL/h via INTRAVENOUS

## 2022-05-22 MED ORDER — DOXYCYCLINE TAB/CAP 100 MG (WRAPPED E-RX)
100 mg | Freq: Two times a day (BID) | ORAL | Status: DC
Start: 2022-05-22 — End: 2022-05-22
  Administered 2022-05-22: 13:00:00 100 mg via ORAL

## 2022-05-22 MED ORDER — ACETAMINOPHEN 325 MG TABLET
325 mg | Freq: Once | ORAL | Status: CP
Start: 2022-05-22 — End: ?
  Administered 2022-05-22: 12:00:00 325 mg via ORAL

## 2022-05-22 MED ORDER — NIRMATRELVIR 300 MG (150 MG X2)-RITONAVIR 100 MG TABLET,DOSE PACK
300 mg (150 mg x 2)-100 mg | ORAL_TABLET | 1 refills | Status: AC
Start: 2022-05-22 — End: ?

## 2022-05-22 MED ORDER — CEFDINIR 300 MG CAPSULE
300 mg | ORAL_CAPSULE | Freq: Two times a day (BID) | ORAL | 1 refills | Status: AC
Start: 2022-05-22 — End: ?

## 2022-05-22 MED ORDER — ONDANSETRON HCL (PF) 4 MG/2 ML INJECTION SOLUTION
4 mg/2 mL | Freq: Once | INTRAVENOUS | Status: CP
Start: 2022-05-22 — End: ?
  Administered 2022-05-22: 13:00:00 4 mL via INTRAVENOUS

## 2022-05-22 MED ORDER — CEFTRIAXONE IV PUSH 1000 MG VIAL & NS (ADULTS)
Freq: Once | INTRAVENOUS | Status: CP
Start: 2022-05-22 — End: ?
  Administered 2022-05-22: 13:00:00 10.000 mL via INTRAVENOUS

## 2022-05-22 MED ORDER — ONDANSETRON HCL (PF) 4 MG/2 ML INJECTION SOLUTION
4 mg/2 mL | Status: DC
Start: 2022-05-22 — End: 2022-05-22

## 2022-05-22 NOTE — ED Notes
7:48 AM Pt to ER from home via walk in triage for evaluation of generalized weakness/flulike symtpoms starting yesterday. Denies sick contacts, cough, SOB, CP, N/V/D. Arrives aaox4 speaking in full clear sentences, RR equal and unlabored. Febrile upon arrival, VSS otherwise. Plan for tylenol, IV fluids, labs, CXR. Pt agrees with plan of care.

## 2022-05-22 NOTE — ED Provider Notes
Chief Complaint Patient presents with ? Flu Like Symptoms   Patient presents with fever, chills, severe headache since yesterday. Patient denies SOB, N&V, diarrhea, chest pain. AOx4, GCS 15. MDM ATTENDING ONLYPatient is a 46 y.o. male  p/w fever, chills, headache, and myalgias since yesterday (05/21/22) afternoon.  He denies URI symptoms, denies cough, denies shortness of breath, denies chest pain, abdominal pain, nausea, vomiting, diarrhea.  No known sick exposures.  Took Tylenol at approximately 3:00 a.m. with ongoing symptoms, prompting ED evaluation.  He does note that yesterday he had some discomfort to his right upper quadrant area/right lower chest.Of note, patient had dental work completed proximally 2 months ago without complication.  He received initial COVID vaccine series, but did not receive subsequent booster doses. He has no known medical problems and does not take any daily medications.  Follows with a primary care physician regularly.Exam notable for regular rate and rhythm, there is audible systolic murmur at the left lower sternal border (not previously known to the patient), the lungs are grossly clear with rhonchi audible at the right lower lung base.  There is no respiratory distress, abdomen is soft nontender.  Oropharynx is clear without erythema or exudate.  There is no decreased neck range of motion/meningismus on examination.PLANPresentation is c/w likely viral illness-will obtain COVID/flu/RSV swab.  Consider pneumonia given findings on auscultation, will obtain chest x-ray.  No clinical suspicion for meningitis/encephalitis.  No clinical suspicion or concerning stigmata on examination for endocarditis.Remaining Labs/Imaging as orderedREASSESSMENTIndependent review of labs/imaging notable for leukocytosis.  On my independent review of chest x-ray, subtle findings are appreciated at the right lower lung base.On formal read, consistent with right lower lobe pneumonia-will treat with ceftriaxone/doxy and plan for outpatient course of treatment.Patient additionally found to be COVID positive, qualifies for Paxlovid treatment.  Under accepting of this medication-or 2 pharmacy of choice.Patient was advised on strict return precautions, plan for outpatient PCP follow-up.  He was also given instruction on isolation.  At this time, patient fever has improved, he is not hypotensive or showing signs of respiratory distress. DISPOSITIONNeed for hospitalization considered and is not indicated at this time.  Kem Boroughs, MDEmergency Medicine Physical ExamED Triage Vitals [05/22/22 0739]BP: (!) 135/90Pulse: (!) 94Pulse from  O2 sat: n/aResp: 16Temp: (S) (!) 102.8 ?F (39.3 ?C)Temp src: OralSpO2: 98 % BP (!) 135/90  - Pulse (!) 94  - Temp (!) 101.6 ?F (38.7 ?C) (Oral)  - Resp 16  - Wt 59.2 kg  - SpO2 98% Physical Exam ProceduresAttestation/Critical CareClinical Impressions as of 05/22/22 0942 COVID-19 Pneumonia of right lower lobe due to infectious organism  ED DispositionDischarge Kem Boroughs, MD10/21/23 0865

## 2022-05-22 NOTE — Discharge Instructions
Please return to the ED if your cough worsens, you develop a high fever, shaking chills, a fast heartbeat, trouble breathing and/or feel you are breathing much faster than usual.Please take all antibiotics as directed until complete.
